# Patient Record
Sex: Female | Born: 1990
Health system: Southern US, Community
[De-identification: ages and names within clinical notes are randomized; demographics above are authoritative.]

## PROBLEM LIST (undated history)

## (undated) DIAGNOSIS — Z803 Family history of malignant neoplasm of breast: Secondary | ICD-10-CM

## (undated) DIAGNOSIS — F419 Anxiety disorder, unspecified: Secondary | ICD-10-CM

## (undated) DIAGNOSIS — R87619 Unspecified abnormal cytological findings in specimens from cervix uteri: Secondary | ICD-10-CM

## (undated) DIAGNOSIS — N946 Dysmenorrhea, unspecified: Secondary | ICD-10-CM

## (undated) HISTORY — DX: Unspecified abnormal cytological findings in specimens from cervix uteri: R87.619

## (undated) HISTORY — DX: Dysmenorrhea, unspecified: N94.6

## (undated) HISTORY — DX: Anxiety disorder, unspecified: F41.9

## (undated) HISTORY — DX: Family history of malignant neoplasm of breast: Z80.3

---

## 2016-11-21 DIAGNOSIS — R87619 Unspecified abnormal cytological findings in specimens from cervix uteri: Secondary | ICD-10-CM

## 2016-11-21 HISTORY — PX: CERVICAL BIOPSY  W/ LOOP ELECTRODE EXCISION: SUR135

## 2016-11-21 HISTORY — DX: Unspecified abnormal cytological findings in specimens from cervix uteri: R87.619

## 2019-02-16 ENCOUNTER — Other Ambulatory Visit: Payer: Self-pay

## 2019-02-16 ENCOUNTER — Ambulatory Visit: Payer: 59 | Admitting: Obstetrics and Gynecology

## 2019-02-16 ENCOUNTER — Other Ambulatory Visit (HOSPITAL_COMMUNITY)
Admission: RE | Admit: 2019-02-16 | Discharge: 2019-02-16 | Disposition: A | Discharge status: Home / Self Care | Payer: 59 | Source: Ambulatory Visit | Attending: Obstetrics and Gynecology | Admitting: Obstetrics and Gynecology

## 2019-02-16 ENCOUNTER — Encounter: Payer: Self-pay | Admitting: Obstetrics and Gynecology

## 2019-02-16 VITALS — BP 110/64 | HR 60 | Resp 16 | Ht 65.5 in | Wt 145.0 lb

## 2019-02-16 DIAGNOSIS — Z803 Family history of malignant neoplasm of breast: Secondary | ICD-10-CM | POA: Diagnosis not present

## 2019-02-16 DIAGNOSIS — Z01419 Encounter for gynecological examination (general) (routine) without abnormal findings: Secondary | ICD-10-CM | POA: Diagnosis not present

## 2019-02-16 DIAGNOSIS — Z3169 Encounter for other general counseling and advice on procreation: Secondary | ICD-10-CM | POA: Diagnosis not present

## 2019-02-16 NOTE — Patient Instructions (Signed)

## 2019-02-16 NOTE — Progress Notes (Signed)
28 y.o. G71P0000 Married Caucasian female here for annual exam.    Stopped using OCPs and planning for future pregnancy.  Has moliminal symptoms.   PCP: Stephanie Buttery, PA-C  Patient's last menstrual period was 02/09/2019 (exact date).     Period Cycle (Days): 30 Period Duration (Days): 7 days Period Pattern: Regular Menstrual Flow: (heavy first 2 days then tapers) Menstrual Control: Tampon, Thin pad Menstrual Control Change Freq (Hours): changes tampon/pad every 6 hours on heaviest day Dysmenorrhea: (!) Mild Dysmenorrhea Symptoms: Cramping     Sexually active: Yes.   female The current method of family planning is condoms everytime.    Exercising: Yes.    high intensity training, cardio and weights Smoker:  no  Health Maintenance: Pap:  11/2017 normal per patient History of abnormal Pap:  Yes, Hx of LEEP for CIN 2/3 11/2016 in [place], [place].  Follow up paps normal. MMG:  n/a Colonoscopy:  n/a BMD:   n/a  Result  n/a TDaP:  07/2018 Gardasil:   Yes, completed x 3 HIV: Neg 10 years ago Hep C: no Screening Labs:   --- Flu vaccine:  Done.    reports that she has never smoked. She has never used smokeless tobacco. She reports current alcohol use of about 1.0 standard drinks of alcohol per week. She reports that she does not use drugs.  Past Medical History:  Diagnosis Date  . Abnormal Pap smear of cervix 11/2016   Hx of LEEP 11/2016 in [place], [place] for CIN 2/3  . Anxiety    Not formally dx'd  . Dysmenorrhea     Past Surgical History:  Procedure Laterality Date  . CERVICAL BIOPSY  W/ LOOP ELECTRODE EXCISION  11/2016   [place] [place]    Current Outpatient Medications  Medication Sig Dispense Refill  . Biotin 1000 MCG CHEW Chew 1 tablet by mouth daily.    . cetirizine (ZYRTEC) 10 MG tablet Take 1 tablet by mouth as needed.    . Multiple Vitamins-Minerals (MULTIVITAMIN GUMMIES ADULT PO) Take 1 tablet by mouth daily.    Marland Kitchen tretinoin (RETIN-A) 0.025 % cream Apply 1 application  topically as needed.     No current facility-administered medications for this visit.     Family History  Problem Relation Age of Onset  . Breast cancer Mother 79       Dec with Met.Breast Ca  . Migraines Mother   . Migraines Sister   . Migraines Brother   . Stroke Maternal Grandfather   . Diabetes Paternal Grandmother   . Diabetes Paternal Grandfather     Review of Systems  All other systems reviewed and are negative.   Exam:   BP 110/64 (BP Location: Right Arm, Patient Position: Sitting, Cuff Size: Normal)   Pulse 60   Resp 16   Ht 5' 5.5" (1.664 m)   Wt 145 lb (65.8 kg)   LMP 02/09/2019 (Exact Date)   BMI 23.76 kg/m     General appearance: alert, cooperative and appears stated age Head: Normocephalic, without obvious abnormality, atraumatic Neck: no adenopathy, supple, symmetrical, trachea midline and thyroid normal to inspection and palpation Lungs: clear to auscultation bilaterally Breasts: normal appearance, no masses or tenderness, No nipple retraction or dimpling, No nipple discharge or bleeding, No axillary or supraclavicular adenopathy Heart: regular rate and rhythm Abdomen: soft, non-tender; no masses, no organomegaly Extremities: extremities normal, atraumatic, no cyanosis or edema Skin: Skin color, texture, turgor normal. No rashes or lesions Lymph nodes: Cervical, supraclavicular, and axillary  nodes normal. No abnormal inguinal nodes palpated Neurologic: Grossly normal  Pelvic: External genitalia:  no lesions              Urethra:  normal appearing urethra with no masses, tenderness or lesions              Bartholins and Skenes: normal                 Vagina: normal appearing vagina with normal color and discharge, no lesions              Cervix: no lesions.  Bleeds with cytobrush.               Pap taken: Yes.   Bimanual Exam:  Uterus:  normal size, contour, position, consistency, mobility, non-tender              Adnexa: no mass, fullness,  tenderness              Chaperone was present for exam.  Assessment:   Well woman visit with normal exam. Hx LEEP for CIN II/III. FH of breast cancer.  Mother had at age 96.  Desire for fertility.  Hx low WBC.   Plan: Mammogram screening. Recommended self breast awareness. Pap and HR HPV as above. Guidelines for Calcium, Vitamin D, regular exercise program including cardiovascular and weight bearing exercise. Start PNV.  Rubella screen and CBC with diff.  Reading about pregnancy planning discussed.  Referral for genetic counseling and testing.  Follow up annually and prn.   After visit summary provided.

## 2019-02-17 LAB — CBC WITH DIFFERENTIAL/PLATELET
Basophils Absolute: 0 10*3/uL (ref 0.0–0.2)
Basos: 1 %
EOS (ABSOLUTE): 0 10*3/uL (ref 0.0–0.4)
Eos: 1 %
Hematocrit: 38.6 % (ref 34.0–46.6)
Hemoglobin: 12.2 g/dL (ref 11.1–15.9)
Immature Grans (Abs): 0 10*3/uL (ref 0.0–0.1)
Immature Granulocytes: 0 %
Lymphocytes Absolute: 1.6 10*3/uL (ref 0.7–3.1)
Lymphs: 34 %
MCH: 30 pg (ref 26.6–33.0)
MCHC: 31.6 g/dL (ref 31.5–35.7)
MCV: 95 fL (ref 79–97)
Monocytes Absolute: 0.3 10*3/uL (ref 0.1–0.9)
Monocytes: 6 %
Neutrophils Absolute: 2.7 10*3/uL (ref 1.4–7.0)
Neutrophils: 58 %
PLATELETS: 280 10*3/uL (ref 150–450)
RBC: 4.06 x10E6/uL (ref 3.77–5.28)
RDW: 11.8 % (ref 11.7–15.4)
WBC: 4.7 10*3/uL (ref 3.4–10.8)

## 2019-02-17 LAB — RUBELLA SCREEN: Rubella Antibodies, IGG: 4.25 index (ref >0.99)

## 2019-02-21 LAB — CYTOLOGY - PAP
Diagnosis: NEGATIVE
HPV: NOT DETECTED

## 2019-02-22 ENCOUNTER — Telehealth: Payer: Self-pay | Admitting: Genetic Counselor

## 2019-02-22 ENCOUNTER — Encounter: Payer: Self-pay | Admitting: Genetic Counselor

## 2019-02-22 ENCOUNTER — Encounter: Payer: Self-pay | Admitting: Obstetrics and Gynecology

## 2019-02-22 NOTE — Telephone Encounter (Signed)
A genetic counseling appt has been scheduled for the pt to see Maylon Cos on 4/6 at 9am. Pt aware to arrive 15 minutes early. Letter mailed.

## 2019-03-28 ENCOUNTER — Other Ambulatory Visit: Payer: 59

## 2019-03-28 ENCOUNTER — Encounter: Payer: 59 | Admitting: Genetic Counselor

## 2019-06-14 ENCOUNTER — Telehealth: Payer: Self-pay | Admitting: Genetic Counselor

## 2019-06-14 NOTE — Telephone Encounter (Signed)
Call pt no answer left mess on voice mail.

## 2019-06-15 ENCOUNTER — Encounter: Payer: Self-pay | Admitting: Genetic Counselor

## 2019-06-15 ENCOUNTER — Other Ambulatory Visit: Payer: Self-pay | Admitting: Genetic Counselor

## 2019-06-15 ENCOUNTER — Inpatient Hospital Stay: Payer: 59 | Attending: Genetic Counselor | Admitting: Genetic Counselor

## 2019-06-15 ENCOUNTER — Inpatient Hospital Stay: Payer: 59

## 2019-06-15 DIAGNOSIS — Z803 Family history of malignant neoplasm of breast: Secondary | ICD-10-CM

## 2019-06-15 NOTE — Progress Notes (Addendum)
REFERRING PROVIDER: Waynette Buttery, PA-C [address] [place],  [place] [address]  PRIMARY PROVIDER:  Waynette Buttery, PA-C  PRIMARY REASON FOR VISIT:  1. Family history of breast cancer      HISTORY OF PRESENT ILLNESS:   I connected with Ms. Durkee on 06/15/2019 at 9 AM EDT by Webex video conference and verified that I am speaking with the correct person using two identifiers.   Patient location: Work Provider location: Work   Ms. Kilcrease, a 28 y.o. female, was seen for a [facility] cancer genetics consultation at the request of Dr. Clydene Laming due to a family history of breast cancer.  Ms. Westervelt presents to clinic today to discuss the possibility of a hereditary predisposition to cancer, genetic testing, and to further clarify her future cancer risks, as well as potential cancer risks for family members.   Ms. Gabor is a 28 y.o. female with no personal history of cancer.    CANCER HISTORY:  Oncology History   No history exists.     RISK FACTORS:  Menarche was at age 17.  First live birth at age N/A.  OCP use for approximately 10 years.  Ovaries intact: yes.  Hysterectomy: no.  Menopausal status: premenopausal.  HRT use: 0 years. Colonoscopy: no; not examined. Mammogram within the last year: no. Number of breast biopsies: 0. Up to date with pelvic exams: yes. Any excessive radiation exposure in the past: no  Past Medical History:  Diagnosis Date  . Abnormal Pap smear of cervix 11/2016   Hx of LEEP 10/2015 in [place], [place] for CIN 2 from colposcopy.  Final LEEP pathology - LGSIL.  Marland Kitchen Anxiety    Not formally dx'd  . Dysmenorrhea   . Family history of breast cancer     Past Surgical History:  Procedure Laterality Date  . CERVICAL BIOPSY  W/ LOOP ELECTRODE EXCISION  11/2016   [place], [place]    Social History   Socioeconomic History  . Marital status: Married    Spouse name: Not on file  . Number of children: Not on file  . Years of education: Not on file  . Highest  education level: Not on file  Occupational History  . Not on file  Social Needs  . Financial resource strain: Not on file  . Food insecurity    Worry: Not on file    Inability: Not on file  . Transportation needs    Medical: Not on file    Non-medical: Not on file  Tobacco Use  . Smoking status: Never Smoker  . Smokeless tobacco: Never Used  Substance and Sexual Activity  . Alcohol use: Yes    Alcohol/week: 1.0 standard drinks    Types: 1 Glasses of wine per week  . Drug use: Never  . Sexual activity: Yes    Birth control/protection: Condom    Comment: condoms everytime  Lifestyle  . Physical activity    Days per week: Not on file    Minutes per session: Not on file  . Stress: Not on file  Relationships  . Social Herbalist on phone: Not on file    Gets together: Not on file    Attends religious service: Not on file    Active member of club or organization: Not on file    Attends meetings of clubs or organizations: Not on file    Relationship status: Not on file  Other Topics Concern  . Not on file  Social  History Narrative  . Not on file     FAMILY HISTORY:  We obtained a detailed, 4-generation family history.  Significant diagnoses are listed below: Family History  Problem Relation Age of Onset  . Breast cancer Mother 35       Dec with Met.Breast Ca  . Migraines Mother   . Migraines Sister   . Migraines Brother   . Stroke Maternal Grandfather   . Diabetes Paternal Grandmother   . Diabetes Paternal Grandfather   . Leukemia Cousin 10       pat first cousin    The patient does not have children.  She has two sisters and a brother who are all cancer free.  Her mother is deceased and her father is living.  Her mother developed breast cancer at 78 and died at 28.  She has one brother who is cancer free.  The maternal grandparents are deceased from non-cancer related issues.  The patient's father is living at 75.  He has eight brothers and one sister  who are all cancer free.  One brother has a son who had leukemia at age 60.  Ms. Rozier is unaware of previous family history of genetic testing for hereditary cancer risks. Patient's maternal ancestors are of [place] and [place] descent, and paternal ancestors are of [place] descent. There is no reported Ashkenazi Jewish ancestry. There is no known consanguinity.  GENETIC COUNSELING ASSESSMENT: Ms. Vanorman is a 28 y.o. female with a family history of breast cancer which is somewhat suggestive of a hereditary cancer syndrome and predisposition to cancer. We, therefore, discussed and recommended the following at today's visit.   DISCUSSION: We discussed that 5 - 10% of breast cancer is hereditary, with most cases associated with BRCA mutations.  There are other genes that can be associated with hereditary breast cancer syndromes.  These include ATM, CHEK2 and PALB2.  We discussed that testing is beneficial for a couple reasons including knowing how to follow individuals and understand if other family members could be at risk for cancer and allow them to undergo genetic testing.   We discussed that some people do not want to undergo genetic testing due to fear of genetic discrimination.  A federal law called the Genetic Information Non-Discrimination Act (GINA) of 2008 helps protect individuals against genetic discrimination based on their genetic test results.  It impacts both health insurance and employment.  With health insurance, it protects against increased premiums, being kicked off insurance or being forced to take a test in order to be insured.  For employment it protects against hiring, firing and promoting decisions based on genetic test results.  Health status due to a cancer diagnosis is not protected under GINA.   We reviewed the characteristics, features and inheritance patterns of hereditary cancer syndromes. We also discussed genetic testing, including the appropriate family members to test, the  process of testing, insurance coverage and turn-around-time for results. We discussed the implications of a negative, positive and/or variant of uncertain significant result. We recommended Ms. Brogden pursue genetic testing for the multi cancer gene panel. The Multi-Gene Panel offered by [facility] includes sequencing and/or deletion duplication testing of the following 85 genes: AIP, ALK, APC, ATM, AXIN2,BAP1,  BARD1, BLM, BMPR1A, BRCA1, BRCA2, BRIP1, CASR, CDC73, CDH1, CDK4, CDKN1B, CDKN1C, CDKN2A (p14ARF), CDKN2A (p16INK4a), CEBPA, CHEK2, CTNNA1, DICER1, DIS3L2, EGFR (c.2369C>T, p.Thr790Met variant only), EPCAM (Deletion/duplication testing only), FH, FLCN, GATA2, GPC3, GREM1 (Promoter region deletion/duplication testing only), HOXB13 (c.251G>A, p.Gly84Glu), HRAS, KIT, MAX, MEN1, MET,  MITF (c.952G>A, p.Glu318Lys variant only), MLH1, MSH2, MSH3, MSH6, MUTYH, NBN, NF1, NF2, NTHL1, PALB2, PDGFRA, PHOX2B, PMS2, POLD1, POLE, POT1, PRKAR1A, PTCH1, PTEN, RAD50, RAD51C, RAD51D, RB1, RECQL4, RET, RNF43, RUNX1, SDHAF2, SDHA (sequence changes only), SDHB, SDHC, SDHD, SMAD4, SMARCA4, SMARCB1, SMARCE1, STK11, SUFU, TERC, TERT, TMEM127, TP53, TSC1, TSC2, VHL, WRN and WT1.    Based on Ms. Wiersma's family history of cancer, she meets medical criteria for genetic testing. Despite that she meets criteria, she may still have an out of pocket cost. We discussed that if her out of pocket cost for testing is over $100, the laboratory will call and confirm whether she wants to proceed with testing.  If the out of pocket cost of testing is less than $100 she will be billed by the genetic testing laboratory.   Based on the patient's family history, a statistical model Designer, fashion/clothing) was used to estimate her risk of developing breast cancer. This estimates her lifetime risk of developing breast cancer to be approximately 28.9%. This estimation does not consider any genetic testing results.  The patient's lifetime breast cancer risk is a  preliminary estimate based on available information using one of several models endorsed by the [place] (ACS). The ACS recommends consideration of breast MRI screening as an adjunct to mammography for patients at high risk (defined as 20% or greater lifetime risk).   Ms. Steenson has been determined to be at high risk for breast cancer.  Therefore, we recommend that annual screening with mammography and breast MRI begin at age 92, or 10 years prior to the age of breast cancer diagnosis in a relative (whichever is earlier).  We discussed that MS. Formoso should discuss her individual situation with her referring physician and determine a breast cancer screening plan with which they are both comfortable.  We will refer her to the [facility] Risk Breast clinic.    PLAN: After considering the risks, benefits, and limitations, Ms. Prada provided informed consent to pursue genetic testing and the blood sample was sent to [facility] for analysis of the multi cancer panel. Results should be available within approximately 2-3 weeks' time, at which point they will be disclosed by telephone to Ms. Sabo, as will any additional recommendations warranted by these results. Ms. Belson will receive a summary of her genetic counseling visit and a copy of her results once available. This information will also be available in Epic.   Lastly, we encouraged Ms. Mcarthy to remain in contact with cancer genetics annually so that we can continuously update the family history and inform her of any changes in cancer genetics and testing that may be of benefit for this family.   Ms. Marsland questions were answered to her satisfaction today. Our contact information was provided should additional questions or concerns arise. Thank you for the referral and allowing [place] to share in the care of your patient.    P. Florene Glen, Ko Olina, [facility] Certified Genetic Counselor [email or web address] phone: [phone]  The  patient was seen for a total of 38 minutes in face-to-face genetic counseling.  This patient was discussed with Drs. Magrinat, Lindi Adie and/or Burr Medico who agrees with the above.    _______________________________________________________________________ For Office Staff:  Number of people involved in session: 1 Was an Intern/ student involved with case: no

## 2019-06-16 ENCOUNTER — Other Ambulatory Visit: Payer: Self-pay

## 2019-06-16 ENCOUNTER — Inpatient Hospital Stay: Payer: 59

## 2019-06-16 DIAGNOSIS — Z803 Family history of malignant neoplasm of breast: Secondary | ICD-10-CM

## 2019-06-28 ENCOUNTER — Other Ambulatory Visit: Payer: Self-pay | Admitting: Oncology

## 2019-07-05 ENCOUNTER — Ambulatory Visit: Payer: Self-pay | Admitting: Genetic Counselor

## 2019-07-05 ENCOUNTER — Telehealth: Payer: Self-pay | Admitting: Genetic Counselor

## 2019-07-05 ENCOUNTER — Encounter: Payer: Self-pay | Admitting: Genetic Counselor

## 2019-07-05 DIAGNOSIS — Z1379 Encounter for other screening for genetic and chromosomal anomalies: Secondary | ICD-10-CM | POA: Insufficient documentation

## 2019-07-05 NOTE — Progress Notes (Signed)
HPI:  Ms. Nawrot was previously seen in the [facility] clinic due to a family history of breast cancer and concerns regarding a hereditary predisposition to cancer. Please refer to our prior cancer genetics clinic note for more information regarding our discussion, assessment and recommendations, at the time. Ms. Vondra recent genetic test results were disclosed to her, as were recommendations warranted by these results. These results and recommendations are discussed in more detail below.  CANCER HISTORY:  Oncology History   No history exists.    FAMILY HISTORY:  We obtained a detailed, 4-generation family history.  Significant diagnoses are listed below: Family History  Problem Relation Age of Onset  . Breast cancer Mother 69       Dec with Met.Breast Ca  . Migraines Mother   . Migraines Sister   . Migraines Brother   . Stroke Maternal Grandfather   . Diabetes Paternal Grandmother   . Diabetes Paternal Grandfather   . Leukemia Cousin 10       pat first cousin    The patient does not have children.  She has two sisters and a brother who are all cancer free.  Her mother is deceased and her father is living.  Her mother developed breast cancer at 56 and died at 49.  She has one brother who is cancer free.  The maternal grandparents are deceased from non-cancer related issues.  The patient's father is living at 7.  He has eight brothers and one sister who are all cancer free.  One brother has a son who had leukemia at age 50.  Ms. Kanno is unaware of previous family history of genetic testing for hereditary cancer risks. Patient's maternal ancestors are of [place] and [place] descent, and paternal ancestors are of [place] descent. There is no reported Ashkenazi Jewish ancestry. There is no known consanguinity.    GENETIC TEST RESULTS: Genetic testing reported out on July 01, 2019 through the Multi-cancer panel found no pathogenic mutations. The Multi-Gene Panel  offered by [facility] includes sequencing and/or deletion duplication testing of the following 85 genes: AIP, ALK, APC, ATM, AXIN2,BAP1,  BARD1, BLM, BMPR1A, BRCA1, BRCA2, BRIP1, CASR, CDC73, CDH1, CDK4, CDKN1B, CDKN1C, CDKN2A (p14ARF), CDKN2A (p16INK4a), CEBPA, CHEK2, CTNNA1, DICER1, DIS3L2, EGFR (c.2369C>T, p.Thr790Met variant only), EPCAM (Deletion/duplication testing only), FH, FLCN, GATA2, GPC3, GREM1 (Promoter region deletion/duplication testing only), HOXB13 (c.251G>A, p.Gly84Glu), HRAS, KIT, MAX, MEN1, MET, MITF (c.952G>A, p.Glu318Lys variant only), MLH1, MSH2, MSH3, MSH6, MUTYH, NBN, NF1, NF2, NTHL1, PALB2, PDGFRA, PHOX2B, PMS2, POLD1, POLE, POT1, PRKAR1A, PTCH1, PTEN, RAD50, RAD51C, RAD51D, RB1, RECQL4, RET, RNF43, RUNX1, SDHAF2, SDHA (sequence changes only), SDHB, SDHC, SDHD, SMAD4, SMARCA4, SMARCB1, SMARCE1, STK11, SUFU, TERC, TERT, TMEM127, TP53, TSC1, TSC2, VHL, WRN and WT1.  The test report has been scanned into EPIC and is located under the Molecular Pathology section of the Results Review tab.  A portion of the result report is included below for reference.     We discussed with Ms. Wimer that because current genetic testing is not perfect, it is possible there may be a gene mutation in one of these genes that current testing cannot detect, but that chance is small.  We also discussed, that there could be another gene that has not yet been discovered, or that we have not yet tested, that is responsible for the cancer diagnoses in the family. It is also possible there is a hereditary cause for the cancer in the family that Ms. Blumenthal did not inherit and therefore  was not identified in her testing.  Therefore, it is important to remain in touch with cancer genetics in the future so that we can continue to offer Ms. Afshar the most up to date genetic testing.   Genetic testing did identify a variant of uncertain significance (VUS) was identified in the APC gene called c.7389A>C.  At this time, it is  unknown if this variant is associated with increased cancer risk or if this is a normal finding, but most variants such as this get reclassified to being inconsequential. It should not be used to make medical management decisions. With time, we suspect the lab will determine the significance of this variant, if any. If we do learn more about it, we will try to contact Ms. Passon to discuss it further. However, it is important to stay in touch with [place] periodically and keep the address and phone number up to date.  ADDITIONAL GENETIC TESTING: We discussed with Ms. Fryman that her genetic testing was fairly extensive.  If there are genes identified to increase cancer risk that can be analyzed in the future, we would be happy to discuss and coordinate this testing at that time.    CANCER SCREENING RECOMMENDATIONS: Ms. Beveridge's test result is considered negative (normal).  This means that we have not identified a hereditary cause for her family history of cancer at this time. Most cancers happen by chance and this negative test suggests that her cancer may fall into this category.    While reassuring, this does not definitively rule out a hereditary predisposition to cancer. It is still possible that there could be genetic mutations that are undetectable by current technology. There could be genetic mutations in genes that have not been tested or identified to increase cancer risk.  Therefore, it is recommended she continue to follow the cancer management and screening guidelines provided by her primary healthcare provider.   An individual's cancer risk and medical management are not determined by genetic test results alone. Overall cancer risk assessment incorporates additional factors, including personal medical history, family history, and any available genetic information that may result in a personalized plan for cancer prevention and surveillance  Based on Ms. Friberg's family of cancer, as well as her genetic  test results, statistical models (Tyrer Cusik)  and literature data were used to estimate her risk of developing breast cancer. These estimate her lifetime risk of developing breast cancer to be approximately 28.9%.  The patient's lifetime breast cancer risk is a preliminary estimate based on available information using one of several models endorsed by the [place] (ACS). The ACS recommends consideration of breast MRI screening as an adjunct to mammography for patients at high risk (defined as 20% or greater lifetime risk). A more detailed breast cancer risk assessment can be considered, if clinically indicated.   Ms. Cozza has been determined to be at high risk for breast cancer.  Therefore, we recommend that annual screening with mammography and breast MRI begin at age 22, or 10 years prior to the age of breast cancer diagnosis in a relative (whichever is earlier).  We discussed that Ms. Mungin should discuss her individual situation with her referring physician and determine a breast cancer screening plan with which they are both comfortable.    We, therefore, discussed that it is reasonable for Ms. Breau to be followed by a high-risk breast cancer clinic; in addition to a yearly mammogram and physical exam by a healthcare provider, she should discuss the usefulness  of an annual breast MRI with the high-risk clinic providers..   RECOMMENDATIONS FOR FAMILY MEMBERS:  Individuals in this family might be at some increased risk of developing cancer, over the general population risk, simply due to the family history of cancer.  We recommended women in this family have a yearly mammogram beginning at age 65, or 11 years younger than the earliest onset of cancer, an annual clinical breast exam, and perform monthly breast self-exams. Women in this family should also have a gynecological exam as recommended by their primary provider. All family members should have a colonoscopy by age 8.  It is  also possible there is a hereditary cause for the cancer in Ms. Lumadue's family that she did not inherit and therefore was not identified in her.  Based on Ms. Espy's family history, we recommended her siblings have genetic counseling and testing. Ms. Simao will let us know if we can be of any assistance in coordinating genetic counseling and/or testing for this family member.   FOLLOW-UP: Lastly, we discussed with Ms. Koran that cancer genetics is a rapidly advancing field and it is possible that new genetic tests will be appropriate for her and/or her family members in the future. We encouraged her to remain in contact with cancer genetics on an annual basis so we can update her personal and family histories and let her know of advances in cancer genetics that may benefit this family.   Our contact number was provided. Ms. Scardina questions were answered to her satisfaction, and she knows she is welcome to call us at anytime with additional questions or concerns.   Roma Kayser, MS, [facility] Certified Genetic Counselor [email or web address]

## 2019-07-05 NOTE — Telephone Encounter (Signed)
Revealed negative genetic testing.  Discussed that we do not know why she has breast cancer or why there is cancer in the family. It could be due to a different gene that we are not testing, or maybe our current technology may not be able to pick something up.  It will be important for her to keep in contact with genetics to keep up with whether additional testing may be needed.  One VUS in [facility].

## 2019-07-17 ENCOUNTER — Telehealth: Payer: Self-pay | Admitting: Obstetrics and Gynecology

## 2019-07-17 DIAGNOSIS — Z803 Family history of malignant neoplasm of breast: Secondary | ICD-10-CM

## 2019-07-17 DIAGNOSIS — Z9189 Other specified personal risk factors, not elsewhere classified: Secondary | ICD-10-CM

## 2019-07-17 NOTE — Telephone Encounter (Signed)
-----   Message from Chauncey Cruel, MD sent at 06/28/2019  9:01 AM EDT ----- So far do not see appt in high risk clinic for this patient  Thanks  GM ----- Message ----- From: Clarene Essex, Counselor Sent: 06/15/2019  11:02 AM EDT To: Brook Oletta Lamas, MD, #  Patient at high risk for breast cancer.  We will refer for high risk clinic.  Patient understands this.  Drs. Burr Medico, Magrinat, and Mosetta Pigeon, please schedule in [facility].

## 2019-07-17 NOTE — Telephone Encounter (Signed)
Please reach out to patient to see if she desires to be referred to the high risk breast cancer screening clinic.   Thank you.

## 2019-07-18 NOTE — Telephone Encounter (Signed)
Spoke with patient, advised as seen below per Dr. Quincy Simmonds. Patient is agreeable to referral to high risk breast cancer clinic, Dr. Jana Hakim. Order placed for referral. Advised patient she will be notified of appt details once scheduled. Patient verbalizes understanding.   Routing to provider for final review. Patient is agreeable to disposition. Will close encounter.   Cc: Magdalene Patricia

## 2019-07-26 ENCOUNTER — Telehealth: Payer: Self-pay | Admitting: Oncology

## 2019-07-26 NOTE — Telephone Encounter (Signed)
Received a new patient referral from Dr. Quincy Simmonds for high risk breast clinic. Pt has been cld and scheduled to see Dr. Jana Hakim on 8/26 at 4pm.

## 2019-08-16 DIAGNOSIS — L700 Acne vulgaris: Secondary | ICD-10-CM | POA: Diagnosis not present

## 2019-08-16 DIAGNOSIS — Z0000 Encounter for general adult medical examination without abnormal findings: Secondary | ICD-10-CM | POA: Diagnosis not present

## 2019-08-16 DIAGNOSIS — Z6824 Body mass index (BMI) 24.0-24.9, adult: Secondary | ICD-10-CM | POA: Diagnosis not present

## 2019-08-16 NOTE — Progress Notes (Signed)
[facility]  [phone] [phone]    ID: Stephanie Cruz DOB: May 07, 1991  MR#: [id number]  TDV#:[id number]  Patient Care Team: Waynette Buttery, PA-C as PCP - General (Family Medicine) Ashby Leflore, Virgie Dad, MD as Consulting Physician (Oncology) Nunzio Cobbs, MD as Consulting Physician (Obstetrics and Gynecology) OTHER MD:   CHIEF COMPLAINT: Breast cancer high risk  CURRENT TREATMENT: Observation   HISTORY OF CURRENT ILLNESS: Cambri Cruz was referred to genetic counseling by her PCP, Corky Crafts, PA-C due to her mother's breast cancer diagnosis at the age of 22. It was estimated at that time that her risk for developing breast cancer was 28.9% via the [place].   Stephanie Cruz did undergo genetic testing on 06/16/2019, with results that revealed no pathogenic mutations through the The Multi-Gene Panel offered by [facility] in AIP, ALK, APC, ATM, AXIN2,BAP1,  BARD1, BLM, BMPR1A, BRCA1, BRCA2, BRIP1, CASR, CDC73, CDH1, CDK4, CDKN1B, CDKN1C, CDKN2A (p14ARF), CDKN2A (p16INK4a), CEBPA, CHEK2, CTNNA1, DICER1, DIS3L2, EGFR (c.2369C>T, p.Thr790Met variant only), EPCAM (Deletion/duplication testing only), FH, FLCN, GATA2, GPC3, GREM1 (Promoter region deletion/duplication testing only), HOXB13 (c.251G>A, p.Gly84Glu), HRAS, KIT, MAX, MEN1, MET, MITF (c.952G>A, p.Glu318Lys variant only), MLH1, MSH2, MSH3, MSH6, MUTYH, NBN, NF1, NF2, NTHL1, PALB2, PDGFRA, PHOX2B, PMS2, POLD1, POLE, POT1, PRKAR1A, PTCH1, PTEN, RAD50, RAD51C, RAD51D, RB1, RECQL4, RET, RNF43, RUNX1, SDHAF2, SDHA (sequence changes only), SDHB, SDHC, SDHD, SMAD4, SMARCA4, SMARCB1, SMARCE1, STK11, SUFU, TERC, TERT, TMEM127, TP53, TSC1, TSC2, VHL, WRN and WT1. A variant of uncertain significance was identified in APC (c.7389A>C (p.Glu2463Asp)).   The patient's subsequent history is as detailed below.   INTERVAL HISTORY: Stephanie Cruz was evaluated in the high risk cancer  clinic on 2019/09/20.   REVIEW OF SYSTEMS: Stephanie Cruz exercises chiefly by running and hiking.  Of course her work is very physical as well.  She denies unusual headaches, visual changes, nausea, vomiting, stiff neck, dizziness, or gait imbalance. There has been no cough, phlegm production, or pleurisy, no chest pain or pressure, and no change in bowel or bladder habits. The patient denies fever, rash, bleeding, unexplained fatigue or unexplained weight loss. A detailed review of systems was otherwise entirely negative.   PAST MEDICAL HISTORY: Past Medical History:  Diagnosis Date  . Abnormal Pap smear of cervix 11/2016   Hx of LEEP 10/2015 in [place], [place] for CIN 2 from colposcopy.  Final LEEP pathology - LGSIL.  Marland Kitchen Anxiety    Not formally dx'd  . Dysmenorrhea   . Family history of breast cancer      PAST SURGICAL HISTORY: Past Surgical History:  Procedure Laterality Date  . CERVICAL BIOPSY  W/ LOOP ELECTRODE EXCISION  11/2016   [place], [place]     FAMILY HISTORY: Family History  Problem Relation Age of Onset  . Breast cancer Mother 64       Dec with Met.Breast Ca  . Migraines Mother   . Migraines Sister   . Migraines Brother   . Stroke Maternal Grandfather   . Diabetes Paternal Grandmother   . Diabetes Paternal Grandfather   . Leukemia Cousin 10       pat first cousin   Astin's father is 38 as of 08-26-19. Patients' mother died from breast cancer at age 89; it was originally diagnosed at age 17. The patient has 1 brother and 2 sisters. Patient denies anyone in her family having ovarian, prostate, or pancreatic cancer. Jaima's paternal first cousin was diagnosed with leukemia at age 75.  GYNECOLOGIC HISTORY:  No LMP recorded. Menarche: 28 years old Age at first live birth: N/A GX P: 0 LMP: Regular, lasting 7 days, 3 heavy Contraceptive: Barrier HRT: no  Hysterectomy?: no BSO?: no   SOCIAL HISTORY: (Current as of 08/17/2019) Stephanie Cruz has a masters in physical therapy  and works in the [employer] system, primarily at the most is come [employer], but possibly soon at the [employer].  She commutes here from [facility].  Her husband Edison Nasuti is a Electrical engineer working in [place].  They are thinking of starting a family within the next year  ADVANCED DIRECTIVES: In the absence of any document to the contrary Shandie's husband Edison Nasuti is her healthcare power of attorney   HEALTH MAINTENANCE: Social History   Tobacco Use  . Smoking status: Never Smoker  . Smokeless tobacco: Never Used  Substance Use Topics  . Alcohol use: Yes    Alcohol/week: 1.0 standard drinks    Types: 1 Glasses of wine per week  . Drug use: Never    Colonoscopy: no  PAP: UTD; Dr. Gala Romney  Bone density: no Mammography: no  No Known Allergies  Current Outpatient Medications  Medication Sig Dispense Refill  . Biotin 1000 MCG CHEW Chew 1 tablet by mouth daily.    . cetirizine (ZYRTEC) 10 MG tablet Take 1 tablet by mouth as needed.    . cholecalciferol (VITAMIN D3) 25 MCG (1000 UT) tablet Take 5,000 Units by mouth daily.    . Prenatal Vit-Fe Fumarate-FA (PRENATAL MULTIVITAMIN) TABS tablet Take 1 tablet by mouth daily at 12 noon.    . tretinoin (RETIN-A) 0.025 % cream Apply 1 application topically as needed.     No current facility-administered medications for this visit.      OBJECTIVE: 28-year old woman in no acute distress  Vitals:   08/17/19 1607  BP: (!) 111/58  Pulse: 74  Resp: 18  Temp: 99.1 F (37.3 C)  SpO2: 98%   Wt Readings from Last 3 Encounters:  08/17/19 148 lb 1.6 oz (67.2 kg)  02/16/19 145 lb (65.8 kg)   Body mass index is 24.27 kg/m.   ECOG FS:0 - Asymptomatic  Ocular: Sclerae unicteric, pupils round and equal Ear-nose-throat: Wearing a mask Lymphatic: No cervical or supraclavicular adenopathy Lungs no rales or rhonchi Heart regular rate and rhythm Abd soft, nontender, positive bowel sounds MSK no focal spinal tenderness, no joint  edema Neuro: non-focal, well-oriented, appropriate affect Breasts: No masses palpated; no skin or nipple changes of concern.  Both axillae are benign.   LAB RESULTS:  CMP  No results found for: NA, K, CL, CO2, GLUCOSE, BUN, CREATININE, CALCIUM, PROT, ALBUMIN, AST, ALT, ALKPHOS, BILITOT, GFRNONAA, GFRAA  No results found for: TOTALPROTELP, ALBUMINELP, A1GS, A2GS, BETS, BETA2SER, GAMS, MSPIKE, SPEI  No results found for: Nils Pyle, [facility]  Lab Results  Component Value Date   WBC 4.7 02/16/2019   NEUTROABS 2.7 02/16/2019   HGB 12.2 02/16/2019   HCT 38.6 02/16/2019   MCV 95 02/16/2019   PLT 280 02/16/2019    _0 @  No results found for: LABCA2  No components found for: PJASNK539  No results for input(s): INR in the last 168 hours.  No results found for: LABCA2  No results found for: JQB341  No results found for: PFX902  No results found for: IOX735  No results found for: CA2729  No components found for: HGQUANT  No results found for: CEA1 / No results found for: CEA1  No results found for: AFPTUMOR  No results found for: CHROMOGRNA  No results found for: PSA1  No visits with results within 3 Day(s) from this visit.  Latest known visit with results is:  Office Visit on 02/16/2019  Component Date Value Ref Range Status  . Rubella Antibodies, IGG 02/16/2019 4.25  Immune >0.99 index Final   Comment:                                 Non-immune       <0.90                                 Equivocal  0.90 - 0.99                                 Immune           >0.99   . WBC 02/16/2019 4.7  3.4 - 10.8 x10E3/uL Final  . RBC 02/16/2019 4.06  3.77 - 5.28 x10E6/uL Final  . Hemoglobin 02/16/2019 12.2  11.1 - 15.9 g/dL Final  . Hematocrit 02/16/2019 38.6  34.0 - 46.6 % Final  . MCV 02/16/2019 95  79 - 97 fL Final  . MCH 02/16/2019 30.0  26.6 - 33.0 pg Final  . MCHC 02/16/2019 31.6  31.5 - 35.7 g/dL Final  . RDW 02/16/2019 11.8  11.7 -  15.4 % Final  . Platelets 02/16/2019 280  150 - 450 x10E3/uL Final  . Neutrophils 02/16/2019 58  Not Estab. % Final  . Lymphs 02/16/2019 34  Not Estab. % Final  . Monocytes 02/16/2019 6  Not Estab. % Final  . Eos 02/16/2019 1  Not Estab. % Final  . Basos 02/16/2019 1  Not Estab. % Final  . Neutrophils Absolute 02/16/2019 2.7  1.4 - 7.0 x10E3/uL Final  . Lymphocytes Absolute 02/16/2019 1.6  0.7 - 3.1 x10E3/uL Final  . Monocytes Absolute 02/16/2019 0.3  0.1 - 0.9 x10E3/uL Final  . EOS (ABSOLUTE) 02/16/2019 0.0  0.0 - 0.4 x10E3/uL Final  . Basophils Absolute 02/16/2019 0.0  0.0 - 0.2 x10E3/uL Final  . Immature Granulocytes 02/16/2019 0  Not Estab. % Final  . Immature Grans (Abs) 02/16/2019 0.0  0.0 - 0.1 x10E3/uL Final  . Adequacy 02/16/2019 Satisfactory for evaluation  endocervical/transformation zone component PRESENT.   Final  . Diagnosis 02/16/2019 NEGATIVE FOR INTRAEPITHELIAL LESIONS OR MALIGNANCY.   Final  . HPV 02/16/2019 NOT DETECTED   Final   Normal Reference Range - NOT Detected  . Material Submitted 02/16/2019 CervicoVaginal Pap [ThinPrep Imaged]   Final    (this displays the last labs from the last 3 days)  No results found for: TOTALPROTELP, ALBUMINELP, A1GS, A2GS, BETS, BETA2SER, GAMS, MSPIKE, SPEI (this displays SPEP labs)  No results found for: KPAFRELGTCHN, LAMBDASER, KAPLAMBRATIO (kappa/lambda light chains)  No results found for: HGBA, HGBA2QUANT, HGBFQUANT, HGBSQUAN (Hemoglobinopathy evaluation)   No results found for: LDH  No results found for: IRON, TIBC, IRONPCTSAT (Iron and TIBC)  No results found for: FERRITIN  Urinalysis No results found for: COLORURINE, APPEARANCEUR, LABSPEC, PHURINE, GLUCOSEU, HGBUR, BILIRUBINUR, KETONESUR, PROTEINUR, UROBILINOGEN, NITRITE, LEUKOCYTESUR   STUDIES:  No results found.   ELIGIBLE FOR AVAILABLE RESEARCH PROTOCOL: no   ASSESSMENT: 28 y.o. Debe Coder, [place] woman at high risk for breast cancer,  based chiefly on her  mother's diagnosis of breast cancer at age 76 and the patient's not having a child age 63--baseline Tyer Cusick's lifetime risk of breast cancer 25-28%    PLAN: We spent more than 50% of today's 50 minute appointment in counseling and coordination of care regarding the biology of the patient's diagnosis and the specifics of her situation. Stephanie Cruz understands she has been found to be at high risk for developing breast cancer based on non-modifiable risk factors. The [employer] evaluator predicts a c.25% risk of her developing breast cancer in her lifetime.  That of course means she has a 75% risk of NOT developing breast cancer.  There are 2 ways of dealing with this problem.  One is risk reduction.  The other one is intensified screening.  One way to reduce the risk is bilateral mastectomies.  We discussed and if she were to go this way, nipple sparing mastectomy with implant reconstruction might be an optimal choice.  Bilateral mastectomies does not entirely eliminate the risk of breast cancer but would reduce it to less than 5% over the patient's lifetime. Note there is no difference in survival--appropriately screened women who keep their breasts will live just as long. For these reasons, and because of the cost, trauma, and body image changes associated with this surgery we generally reserve it for special cases.  Another approach to risk reduction is antiestrogens.  Aromatase inhibitors [anastrozole, letrozole and exemestane] can be used in post-menopausal patients. Tamoxifen or raloxefene can be used in pre- or post-menopausal patients.   We discussed these agents in detail including their possible toxicities, side effects and complications.  We also discussed the fact that either of these classes of drugs taken for 5 years would cut her risk of breast cancer in half.  Given their low cost, effectiveness and generally favorable side effect profile, this is a frequent choice for patients seeking  to reduce their breast cancer risk. And of course if a patient tries tone of these drugs and develops side effects she can simply stop the agent with no permanent effects on her system.  A third way to reduce her cancer risk--not specific to breast cancer--is to optimize her diet and exercise routine.  Ideally she would be on a mostly vegetable diet with limited carbohydrates. Meats and dairy are allowed. The exercise goal is 45 minutes 5 times a week of activity vigorous enough to induce perspiration. This would reduce her risk of any cancer developing by between 1 and 3%.    After discussing the options for risk reduction, we discussed intensified screening.  This would include yearly mammography with tomography, biannual breast exams by an MD, and a yearly breast MRI.  Stephanie Cruz understands that adding the MRI may increase cost and also put her at risk for false positives, which may lead to unnecessary procedures.  The point of course would be to find any cancer that may develop at the very earliest stages to increase the chance of cure with minimal interventions (specifically without chemotherapy).  While Stephanie Cruz is not ready to make a definitive choice today, she is most interested in tamoxifen and intensified screening. Because of her family plans, a good time to consider starting this would be when she turns 42. She will let me know at that time and we will set her up for initial mammography (10 years before her mother's age at initial diagnosis). She may consider tamoxifen at that time as well  Incidentally she showed me  a copy of her mother's pathology. She had a grade 3 invasive ductal carcinoma which was estrogen and progesterone receptor negative, but HER-2 amplified.  My understanding is that HER-2 positive breast cancers tend to be sporadic and not involved in clear heritability patterns, but I will have to do more reading regarding this.  It may be that her risk of breast cancer is accordingly  lower than predicted.  Stephanie Cruz has a good understanding of the overall plan. She agrees with it. She knows the goal of treatment in her case is prevention. She will call with any problems that may develop before her next visit here.     Mallisa Alameda, Virgie Dad, MD  08/17/19 5:12 PM Medical Oncology and Hematology [facility] [address] [place], [place] [address] Tel. [phone]    Fax. [phone]   I, Jacqualyn Posey am acting as a Education administrator for Chauncey Cruel, MD.   I, Lurline Del MD, have reviewed the above documentation for accuracy and completeness, and I agree with the above.

## 2019-08-17 ENCOUNTER — Inpatient Hospital Stay: Payer: 59 | Attending: Genetic Counselor | Admitting: Oncology

## 2019-08-17 ENCOUNTER — Other Ambulatory Visit: Payer: Self-pay

## 2019-08-17 VITALS — BP 111/58 | HR 74 | Temp 99.1°F | Resp 18 | Ht 65.5 in | Wt 148.1 lb

## 2019-08-17 DIAGNOSIS — F419 Anxiety disorder, unspecified: Secondary | ICD-10-CM | POA: Insufficient documentation

## 2019-08-17 DIAGNOSIS — Z1239 Encounter for other screening for malignant neoplasm of breast: Secondary | ICD-10-CM | POA: Diagnosis not present

## 2019-08-17 DIAGNOSIS — Z803 Family history of malignant neoplasm of breast: Secondary | ICD-10-CM | POA: Insufficient documentation

## 2019-08-17 DIAGNOSIS — Z1379 Encounter for other screening for genetic and chromosomal anomalies: Secondary | ICD-10-CM

## 2019-08-17 DIAGNOSIS — Z79899 Other long term (current) drug therapy: Secondary | ICD-10-CM | POA: Insufficient documentation

## 2019-11-14 DIAGNOSIS — Z20828 Contact with and (suspected) exposure to other viral communicable diseases: Secondary | ICD-10-CM | POA: Diagnosis not present

## 2019-11-25 ENCOUNTER — Encounter: Payer: Self-pay | Admitting: *Deleted

## 2019-11-25 ENCOUNTER — Ambulatory Visit (INDEPENDENT_AMBULATORY_CARE_PROVIDER_SITE_OTHER): Payer: 59 | Admitting: *Deleted

## 2019-11-25 ENCOUNTER — Other Ambulatory Visit: Payer: Self-pay

## 2019-11-25 DIAGNOSIS — Z3201 Encounter for pregnancy test, result positive: Secondary | ICD-10-CM | POA: Diagnosis not present

## 2019-11-25 DIAGNOSIS — Z3200 Encounter for pregnancy test, result unknown: Secondary | ICD-10-CM

## 2019-11-25 LAB — POCT URINE PREGNANCY: Preg Test, Ur: POSITIVE — AB

## 2019-11-25 NOTE — Progress Notes (Signed)
Pt here for pregnancy confirmation which was positive.  Her LMP was 10/19/19 and she is scheduled for NOB in Jan

## 2019-12-06 ENCOUNTER — Telehealth: Payer: Self-pay | Admitting: Obstetrics and Gynecology

## 2019-12-06 DIAGNOSIS — L700 Acne vulgaris: Secondary | ICD-10-CM | POA: Diagnosis not present

## 2019-12-06 NOTE — Telephone Encounter (Signed)
Left message on voicemail to call and reschedule cancelled appointment. °

## 2019-12-23 NOTE — L&D Delivery Note (Signed)
OB/GYN Faculty Practice Delivery Note  Stephanie Cruz is a 29 y.o. now G1P0000 s/p SVD at [redacted]w[redacted]d who was admitted for SOL.   ROM: 12h 62m with clear fluid GBS Status: Negative Maximum Maternal Temperature: 98.7  Delivery Note At 10:12 PM a viable female was delivered via Vaginal, Spontaneous (Presentation: Left Occiput Anterior).  APGAR: 8, 9; weight 8 lbs 10.5 oz (3926 gm).   Placenta status: Spontaneous, Intact.  Cord: 3 vessels with the following complications: None.  Cord pH: N/A  Anesthesia: Epidural Episiotomy: none Lacerations: 1st degree;Vaginal; bilateral periurethral hemastatic tears (not repaired) Suture Repair: 3.0 vicryl rapide Est. Blood Loss (mL): 347  Postpartum Planning [x]  Mom to postpartum.  Baby to Couplet care / Skin to Skin. [x]  plans to breast feed [x]  message to sent to schedule follow-up  [x]  vaccines UTD  , MSN, CNM 07/27/20, 11:48 PM

## 2019-12-25 ENCOUNTER — Telehealth: Payer: 59 | Admitting: Physician Assistant

## 2019-12-25 DIAGNOSIS — R197 Diarrhea, unspecified: Secondary | ICD-10-CM

## 2019-12-25 NOTE — Progress Notes (Signed)
We are sorry that you are not feeling well.  Here is how we plan to help!  Based on what you have shared with me it looks like you have Acute Infectious Diarrhea.  Most cases of acute diarrhea are due to infections with virus and bacteria and are self-limited conditions lasting less than 14 days.  Take two tablet now and then one after each loose stool up to 6 a day.  Antibiotics are not needed for most people with diarrhea.  It is good that things slowed down and this should only continue over the next 24-48 hours. The main thing is for you to stay hydrated. If you become unable to keep in fluids, you need to be seen in the ER. Please call your GYN office later today (on-call nurse) or at least by tomorrow morning to update them and to get further instruction.   HOME CARE  We recommend changing your diet to help with your symptoms for the next few days.  Drink plenty of fluids that contain water salt and sugar. Sports drinks such as Gatorade may help.   You may try broths, soups, bananas, applesauce, soft breads, mashed potatoes or crackers.   You are considered infectious for as long as the diarrhea continues. Hand washing or use of alcohol based hand sanitizers is recommend.  It is best to stay out of work or school until your symptoms stop.   GET HELP RIGHT AWAY  If you have dark yellow colored urine or do not pass urine frequently you should drink more fluids.    If your symptoms worsen   If you feel like you are going to pass out (faint)  You have a new problem  MAKE SURE YOU   Understand these instructions.  Will watch your condition.  Will get help right away if you are not doing well or get worse.  Your e-visit answers were reviewed by a board certified advanced clinical practitioner to complete your personal care plan.  Depending on the condition, your plan could have included both over the counter or prescription medications.  If there is a problem please reply   once you have received a response from your provider.  Your safety is important to [place].  If you have drug allergies check your prescription carefully.    You can use MyChart to ask questions about today's visit, request a non-urgent call back, or ask for a work or school excuse for 24 hours related to this e-Visit. If it has been greater than 24 hours you will need to follow up with your provider, or enter a new e-Visit to address those concerns.   You will get an e-mail in the next two days asking about your experience.  I hope that your e-visit has been valuable and will speed your recovery. Thank you for using e-visits.

## 2019-12-25 NOTE — Progress Notes (Signed)
I have spent 5 minutes in review of e-visit questionnaire, review and updating patient chart, medical decision making and response to patient.   Traeson Dusza Cody Tullio Chausse, PA-C    

## 2019-12-27 ENCOUNTER — Encounter: Payer: Self-pay | Admitting: *Deleted

## 2019-12-28 ENCOUNTER — Ambulatory Visit (INDEPENDENT_AMBULATORY_CARE_PROVIDER_SITE_OTHER): Payer: 59 | Admitting: Obstetrics and Gynecology

## 2019-12-28 ENCOUNTER — Other Ambulatory Visit: Payer: Self-pay

## 2019-12-28 ENCOUNTER — Encounter: Payer: Self-pay | Admitting: Obstetrics and Gynecology

## 2019-12-28 DIAGNOSIS — Z3400 Encounter for supervision of normal first pregnancy, unspecified trimester: Secondary | ICD-10-CM | POA: Insufficient documentation

## 2019-12-28 DIAGNOSIS — Z362 Encounter for other antenatal screening follow-up: Secondary | ICD-10-CM

## 2019-12-28 DIAGNOSIS — Z113 Encounter for screening for infections with a predominantly sexual mode of transmission: Secondary | ICD-10-CM

## 2019-12-28 DIAGNOSIS — Z3401 Encounter for supervision of normal first pregnancy, first trimester: Secondary | ICD-10-CM | POA: Diagnosis not present

## 2019-12-28 DIAGNOSIS — Z3A10 10 weeks gestation of pregnancy: Secondary | ICD-10-CM

## 2019-12-28 MED ORDER — DOXYLAMINE-PYRIDOXINE 10-10 MG PO TBEC: 2.0000 | DELAYED_RELEASE_TABLET | Freq: Every day | ORAL | 1 refills | Status: DC

## 2019-12-28 MED FILL — DOXYLAMINE-PYRIDOXINE 10-10: 10-10 | 25 days supply | Qty: 100 | Fill #0

## 2019-12-28 NOTE — Progress Notes (Addendum)
History:   Stephanie Cruz is a 29 y.o. G1P0000 at 74w0dby LMP being seen today for her first obstetrical visit.  Her obstetrical history is significant for Family history of breast cancer; mother, LEEP.  Patient does intend to breast feed. Pregnancy history fully reviewed. Planned pregnancy.   Patient reports nausea, no vomiting.   HISTORY: OB History  Gravida Para Term Preterm AB Living  1 0 0 0 0 0  SAB TAB Ectopic Multiple Live Births  0 0 0 0 0    # Outcome Date GA Lbr Len/2nd Weight Sex Delivery Anes PTL Lv  1 Current             Last pap smear was done 01/2019 and was normal  Past Medical History:  Diagnosis Date  . Abnormal Pap smear of cervix 11/2016   [place]  Final LEEP pathology - LGSIL.  .Marland KitchenAnxiety    Not formally dx'd  . Dysmenorrhea   . Family history of breast cancer    Past Surgical History:  Procedure Laterality Date  . CERVICAL BIOPSY  W/ LOOP ELECTRODE EXCISION  11/2016   [place] [place]   Family History  Problem Relation Age of Onset  . Breast cancer Mother 415      Dec with Met.Breast Ca  . Migraines Mother   . Migraines Sister   . Migraines Brother   . Stroke Maternal Grandfather   . Diabetes Paternal Grandmother   . Diabetes Paternal Grandfather   . Leukemia Cousin 10       pat first cousin   Social History   Tobacco Use  . Smoking status: Never Smoker  . Smokeless tobacco: Never Used  Substance Use Topics  . Alcohol use: Yes    Alcohol/week: 1.0 standard drinks    Types: 1 Glasses of wine per week  . Drug use: Never   No Known Allergies Current Outpatient Medications on File Prior to Visit  Medication Sig Dispense Refill  . Biotin 1000 MCG CHEW Chew 1 tablet by mouth daily.    . cetirizine (ZYRTEC) 10 MG tablet Take 1 tablet by mouth as needed.    . cholecalciferol (VITAMIN D3) 25 MCG (1000 UT) tablet Take 5,000 Units by mouth daily.    . Docosahexaenoic Acid (PRENATAL DHA) 200  MG CAPS      No current facility-administered medications on file prior to visit.    Review of Systems Pertinent items noted in HPI and remainder of comprehensive ROS otherwise negative. Physical Exam:   Vitals:   12/28/19 0825  BP: (!) 100/54  Pulse: (!) 56  Temp: 98.4 F (36.9 C)  Weight: 140 lb (63.5 kg)   Fetal Heart Rate (bpm): pos System: General: well-developed, well-nourished female in no acute distress   Breasts:  normal appearance, no masses or tenderness bilaterally   Skin: normal coloration and turgor, no rashes   Neurologic: oriented, normal, negative, normal mood   Extremities: normal strength, tone, and muscle mass, ROM of all joints is normal   HEENT PERRLA, extraocular movement intact and sclera clear, anicteric   Mouth/Teeth mucous membranes moist, pharynx normal without lesions and dental hygiene good   Neck supple and no masses   Cardiovascular: regular rate and rhythm   Respiratory:  no respiratory distress, normal breath sounds   Abdomen: soft, non-tender; bowel sounds normal; no masses,  no organomegaly  Bedside Ultrasound for FHR check: Patient informed that  the ultrasound is considered a limited obstetric ultrasound and is not intended to be a complete ultrasound exam.  Patient also informed that the ultrasound is not being completed with the intent of assessing for fetal or placental anomalies or any pelvic abnormalities.  Explained that the purpose of today's ultrasound is to assess for fetal heart rate.  Patient acknowledges the purpose of the exam and the limitations of the study.     Assessment:    Pregnancy: G1P0000 Patient Active Problem List   Diagnosis Date Noted  . Supervision of normal first pregnancy 12/28/2019  . Breast cancer screening, high risk patient 08/17/2019  . Genetic testing 07/05/2019  . Family history of breast cancer      Plan:   1. Encounter for supervision of normal first pregnancy in first trimester  - Obstetric  panel - HIV antibody (with reflex) - Culture, OB Urine - Babyscripts Schedule Optimization - Urine cytology ancillary only([place]) - TSH *Return for NIPS. AFP after 16 weeks. [place] ordered.  -Some nausea, no vomiting. Rx: Diclegis.   Initial labs drawn. Continue prenatal vitamins. Genetic Screening discussed, NIPS: requested. Ultrasound discussed; fetal anatomic survey: requested. Problem list reviewed and updated. The nature of [place] with multiple MDs and other Advanced Practice Providers was explained to patient; also emphasized that residents, students are part of our team. Routine obstetric precautions reviewed.    , Artist Pais, [place] for [employer], [place]

## 2019-12-28 NOTE — Progress Notes (Signed)
Bedside U/S shows single IUP with positive FHT and CRL measures 33.82mm  GA [redacted]w[redacted]d

## 2019-12-29 LAB — HIV ANTIBODY (ROUTINE TESTING W REFLEX): HIV 1&2 Ab, 4th Generation: NONREACTIVE

## 2019-12-29 LAB — TSH: TSH: 1.23 mIU/L

## 2019-12-29 LAB — OBSTETRIC PANEL
Absolute Monocytes: 462 cells/uL (ref 200–950)
Antibody Screen: NOT DETECTED
Basophils Absolute: 32 cells/uL (ref 0–200)
Basophils Relative: 0.4 %
Eosinophils Absolute: 8 cells/uL — ABNORMAL LOW (ref 15–500)
Eosinophils Relative: 0.1 %
HCT: 35.9 % (ref 35.0–45.0)
Hemoglobin: 12.1 g/dL (ref 11.7–15.5)
Hepatitis B Surface Ag: NONREACTIVE
Lymphs Abs: 1013 cells/uL (ref 850–3900)
MCH: 31.4 pg (ref 27.0–33.0)
MCHC: 33.7 g/dL (ref 32.0–36.0)
MCV: 93.2 fL (ref 80.0–100.0)
MPV: 10.5 fL (ref 7.5–12.5)
Monocytes Relative: 5.7 %
Neutro Abs: 6585 cells/uL (ref 1500–7800)
Neutrophils Relative %: 81.3 %
Platelets: 283 10*3/uL (ref 140–400)
RBC: 3.85 10*6/uL (ref 3.80–5.10)
RDW: 12.4 % (ref 11.0–15.0)
RPR Ser Ql: NONREACTIVE
Rubella: 4.19 Index
Total Lymphocyte: 12.5 %
WBC: 8.1 10*3/uL (ref 3.8–10.8)

## 2019-12-29 LAB — URINE CYTOLOGY ANCILLARY ONLY
Chlamydia: NEGATIVE
Comment: NEGATIVE
Comment: NORMAL
Neisseria Gonorrhea: NEGATIVE

## 2020-01-01 LAB — CULTURE, OB URINE

## 2020-01-01 LAB — URINE CULTURE, OB REFLEX

## 2020-01-11 ENCOUNTER — Other Ambulatory Visit: Payer: Self-pay

## 2020-01-11 ENCOUNTER — Ambulatory Visit (INDEPENDENT_AMBULATORY_CARE_PROVIDER_SITE_OTHER): Payer: 59

## 2020-01-11 DIAGNOSIS — Z31430 Encounter of female for testing for genetic disease carrier status for procreative management: Secondary | ICD-10-CM | POA: Diagnosis not present

## 2020-01-11 DIAGNOSIS — Z36 Encounter for antenatal screening for chromosomal anomalies: Secondary | ICD-10-CM | POA: Diagnosis not present

## 2020-01-11 DIAGNOSIS — Z3401 Encounter for supervision of normal first pregnancy, first trimester: Secondary | ICD-10-CM | POA: Diagnosis not present

## 2020-01-11 DIAGNOSIS — Z3400 Encounter for supervision of normal first pregnancy, unspecified trimester: Secondary | ICD-10-CM

## 2020-01-11 NOTE — Progress Notes (Signed)
Pt here for Panorama. 

## 2020-01-20 ENCOUNTER — Encounter: Payer: Self-pay | Admitting: *Deleted

## 2020-01-20 DIAGNOSIS — Z3400 Encounter for supervision of normal first pregnancy, unspecified trimester: Secondary | ICD-10-CM

## 2020-01-23 ENCOUNTER — Other Ambulatory Visit: Payer: Self-pay | Admitting: Obstetrics and Gynecology

## 2020-01-23 MED ORDER — PROMETHAZINE HCL 25 MG PO TABS: 25.0000 mg | ORAL_TABLET | Freq: Four times a day (QID) | ORAL | 1 refills | Status: DC | PRN

## 2020-01-23 MED ORDER — ONDANSETRON 8 MG PO TBDP: 8.0000 mg | ORAL_TABLET | Freq: Three times a day (TID) | ORAL | 1 refills | Status: DC | PRN

## 2020-01-23 MED FILL — PROMETHAZINE 25 MG TABLET: 25 | 8 days supply | Qty: 30 | Fill #0

## 2020-01-23 MED FILL — ONDANSETRON ODT 8 MG TABLET: 8 | 10 days supply | Qty: 30 | Fill #0

## 2020-01-23 NOTE — Progress Notes (Signed)
Revonda Standard called requesting additional N/v medication. Rx: Phenergan, Zofran   Venia Carbon I, NP 01/23/2020 10:35 AM

## 2020-01-30 ENCOUNTER — Other Ambulatory Visit: Payer: Self-pay

## 2020-01-30 ENCOUNTER — Ambulatory Visit: Payer: 59 | Admitting: *Deleted

## 2020-01-30 DIAGNOSIS — Z3400 Encounter for supervision of normal first pregnancy, unspecified trimester: Secondary | ICD-10-CM

## 2020-01-30 NOTE — Progress Notes (Signed)
Pt here for NIPT redraw due to low fetal fraction.  No charge

## 2020-02-08 ENCOUNTER — Encounter: Payer: Self-pay | Admitting: Nurse Practitioner

## 2020-02-08 ENCOUNTER — Ambulatory Visit (INDEPENDENT_AMBULATORY_CARE_PROVIDER_SITE_OTHER): Payer: 59 | Admitting: Nurse Practitioner

## 2020-02-08 ENCOUNTER — Other Ambulatory Visit: Payer: Self-pay

## 2020-02-08 DIAGNOSIS — Z3402 Encounter for supervision of normal first pregnancy, second trimester: Secondary | ICD-10-CM | POA: Diagnosis not present

## 2020-02-08 DIAGNOSIS — Z3A16 16 weeks gestation of pregnancy: Secondary | ICD-10-CM

## 2020-02-08 DIAGNOSIS — Z3400 Encounter for supervision of normal first pregnancy, unspecified trimester: Secondary | ICD-10-CM | POA: Diagnosis not present

## 2020-02-08 NOTE — Progress Notes (Signed)
    Subjective:  Stephanie Cruz is a 29 y.o. G1P0000 at [redacted]w[redacted]d being seen today for ongoing prenatal care.  She is currently monitored for the following issues for this low-risk pregnancy and has Family history of breast cancer; Genetic testing; Breast cancer screening, high risk patient; and Supervision of normal first pregnancy on their problem list.  Patient reports no complaints.   .  .   . Denies leaking of fluid.   The following portions of the patient's history were reviewed and updated as appropriate: allergies, current medications, past family history, past medical history, past social history, past surgical history and problem list. Problem list updated.  Objective:   Vitals:   02/08/20 0814  BP: 109/66  Pulse: 63  Temp: 98.3 F (36.8 C)  Weight: 145 lb (65.8 kg)    Fetal Status: Fetal Heart Rate (bpm): 143 Fundal Height: 16 cm       General:  Alert, oriented and cooperative. Patient is in no acute distress.  Skin: Skin is warm and dry. No rash noted.   Cardiovascular: Normal heart rate noted  Respiratory: Normal respiratory effort, no problems with respiration noted  Abdomen: Soft, gravid, appropriate for gestational age.       Pelvic:  Cervical exam deferred        Extremities: Normal range of motion.     Mental Status: Normal mood and affect. Normal behavior. Normal judgment and thought content.   Urinalysis:      Assessment and Plan:  Pregnancy: G1P0000 at [redacted]w[redacted]d  1. Supervision of normal first pregnancy, antepartum Discussed option of getting Covid vaccine - will likely choose to get vaccine in a few weeks.  Wanted to wait until 3rd trimester. Reviewed online childbirth and breastfeeding classes Nausea has resolved. Encouraged to enter BP in babyscripts weekly  - Alpha fetoprotein, maternal  Preterm labor symptoms and general obstetric precautions including but not limited to vaginal bleeding, contractions, leaking of fluid and fetal movement were  reviewed in detail with the patient. Please refer to After Visit Summary for other counseling recommendations.  Return in about 4 weeks (around 03/07/2020) for virtual ROB.  Nolene Bernheim, RN, MSN, NP-BC Nurse Practitioner, [facility] for [employer], [facility] 02/08/2020 11:24 AM

## 2020-02-08 NOTE — Patient Instructions (Signed)

## 2020-02-13 LAB — ALPHA FETOPROTEIN, MATERNAL
AFP MoM: 1.55
AFP, Serum: 52.3 ng/mL
Calc'd Gestational Age: 16 weeks
Maternal Wt: 145 [lb_av]
Risk for ONTD: 1
Twins-AFP: 1

## 2020-02-13 LAB — TIQ-AOE

## 2020-02-20 ENCOUNTER — Ambulatory Visit: Payer: 59 | Admitting: Obstetrics and Gynecology

## 2020-02-29 ENCOUNTER — Ambulatory Visit (HOSPITAL_COMMUNITY)
Admission: RE | Admit: 2020-02-29 | Discharge: 2020-02-29 | Disposition: A | Discharge status: Home / Self Care | Payer: 59 | Source: Ambulatory Visit | Attending: Obstetrics and Gynecology | Admitting: Obstetrics and Gynecology

## 2020-02-29 ENCOUNTER — Other Ambulatory Visit: Payer: Self-pay

## 2020-02-29 DIAGNOSIS — Z3401 Encounter for supervision of normal first pregnancy, first trimester: Secondary | ICD-10-CM | POA: Insufficient documentation

## 2020-02-29 DIAGNOSIS — Z363 Encounter for antenatal screening for malformations: Secondary | ICD-10-CM | POA: Diagnosis not present

## 2020-02-29 DIAGNOSIS — Z3A19 19 weeks gestation of pregnancy: Secondary | ICD-10-CM

## 2020-03-08 ENCOUNTER — Telehealth (INDEPENDENT_AMBULATORY_CARE_PROVIDER_SITE_OTHER): Payer: 59 | Admitting: Obstetrics & Gynecology

## 2020-03-08 ENCOUNTER — Other Ambulatory Visit: Payer: Self-pay

## 2020-03-08 DIAGNOSIS — Z3400 Encounter for supervision of normal first pregnancy, unspecified trimester: Secondary | ICD-10-CM

## 2020-03-08 NOTE — Patient Instructions (Signed)
Return to office for any scheduled appointments. Call the office or go to the MAU at [facility] [place] if:  You begin to have strong, frequent contractions  Your water breaks.  Sometimes it is a big gush of fluid, sometimes it is just a trickle that keeps getting your panties wet or running down your legs  You have vaginal bleeding.  It is normal to have a small amount of spotting if your cervix was checked.   You do not feel your baby moving like normal.  If you do not, get something to eat and drink and lay down and focus on feeling your baby move.   If your baby is still not moving like normal, you should call the office or go to MAU.  Any other obstetric concerns.   

## 2020-03-08 NOTE — Progress Notes (Signed)
OBSTETRICS PRENATAL VIRTUAL VISIT ENCOUNTER NOTE  Provider location: Center for [facility] [place]   I connected with Stephanie Cruz on 03/08/20 at  2:30 PM EDT by MyChart Video Encounter at home and verified that I am speaking with the correct person using two identifiers.   I discussed the limitations, risks, security and privacy concerns of performing an evaluation and management service virtually and the availability of in person appointments. I also discussed with the patient that there may be a patient responsible charge related to this service. The patient expressed understanding and agreed to proceed. Subjective:  Stephanie Cruz is a 29 y.o. G1P0000 at [redacted]w[redacted]d being seen today for ongoing prenatal care.  She is currently monitored for the following issues for this low-risk pregnancy and has Family history of breast cancer; Genetic testing; Breast cancer screening, high risk patient; and Supervision of normal first pregnancy on their problem list.  Patient reports no complaints.   . Vag. Bleeding: None.  Movement: Absent. Denies any leaking of fluid.   The following portions of the patient's history were reviewed and updated as appropriate: allergies, current medications, past family history, past medical history, past social history, past surgical history and problem list.   Objective:  There were no vitals filed for this visit.  Fetal Status:     Movement: Absent     General:  Alert, oriented and cooperative. Patient is in no acute distress.  Respiratory: Normal respiratory effort, no problems with respiration noted  Mental Status: Normal mood and affect. Normal behavior. Normal judgment and thought content.  Rest of physical exam deferred due to type of encounter  Imaging: [place] MFM OB COMP + 14 WK  Result Date: 02/29/2020 ----------------------------------------------------------------------  OBSTETRICS REPORT                       (Signed Final  02/29/2020 09:30 am) ---------------------------------------------------------------------- Patient Info  ID #:       [id number]                          D.O.B.:  1991/04/07 (29 yrs)  Name:       Stephanie Cruz             Visit Date: 02/29/2020 09:10 am ---------------------------------------------------------------------- Performed By  Performed By:     Berlinda Last          Ref. Address:     [address] [place],                                                              [address]  Attending:        Johnell Comings MD         [facility]  Referred By:      Duane Lope NP ---------------------------------------------------------------------- Orders   #  Description                          Code         Ordered By   1  [place] MFM OB COMP + 14 WK               76805.01     JENNIFER RASCH  ----------------------------------------------------------------------   #  Order #                    Accession #                 Episode #   1  [id number]                  [id number]                  [id number]  ---------------------------------------------------------------------- Indications   Encounter for antenatal screening for          Z36.3   malformations (low risk NIPS)   [redacted] weeks gestation of pregnancy                Z3A.19  ---------------------------------------------------------------------- Fetal Evaluation  Num Of Fetuses:         1  Fetal Heart Rate(bpm):  146  Cardiac Activity:       Observed  Presentation:           Breech  Placenta:               Anterior  P. Cord Insertion:      Visualized  Amniotic Fluid  AFI FV:      Within normal limits ---------------------------------------------------------------------- Biometry  BPD:        45  mm     G. Age:  19w 4d         76  %    CI:        72.72   %    70 -  86                                                          FL/HC:      19.1   %    16.1 - 18.3  HC:      167.8  mm     G. Age:  19w 3d         64  %    HC/AC:      1.16        1.09 - 1.39  AC:       145   mm     G. Age:  19w 6d  73  %    FL/BPD:     71.1   %  FL:         32  mm     G. Age:  20w 0d         77  %    FL/AC:      22.1   %    20 - 24  HUM:      29.4  mm     G. Age:  19w 4d         66  %  CER:      20.1  mm     G. Age:  19w 1d         52  %  Est. FW:     316  gm    0 lb 11 oz      89  % ---------------------------------------------------------------------- OB History  Gravidity:    1         Term:   0        Prem:   0        SAB:   0  TOP:          0       Ectopic:  0        Living: 0 ---------------------------------------------------------------------- Gestational Age  LMP:           19w 0d        Date:  10/19/19                 EDD:   07/25/20  U/S Today:     19w 5d                                        EDD:   07/20/20  Best:          19w 0d     Det. By:  LMP  (10/19/19)          EDD:   07/25/20 ---------------------------------------------------------------------- Anatomy  Cranium:               Appears normal         Aortic Arch:            Appears normal  Cavum:                 Appears normal         Ductal Arch:            Appears normal  Ventricles:            Appears normal         Diaphragm:              Appears normal  Choroid Plexus:        Appears normal         Stomach:                Appears normal, left                                                                        sided  Cerebellum:  Appears normal         Abdomen:                Appears normal  Posterior Fossa:       Appears normal         Abdominal Wall:         Appears nml (cord                                                                        insert, abd wall)  Face:                  Appears normal         Cord Vessels:           Appears normal (3                         (orbits and profile)                            vessel cord)  Lips:                  Appears normal         Kidneys:                Appear normal  Thoracic:              Appears normal         Bladder:                Appears normal  Heart:                 Appears normal         Spine:                  Appears normal                         (4CH, axis, and                         situs)  RVOT:                  Appears normal         Upper Extremities:      Appears normal  LVOT:                  Appears normal         Lower Extremities:      Appears normal  Other:  Heels and right 5th digit visualized. 3VV and 3VTV visualized. Nasal          bone visualized. ---------------------------------------------------------------------- Cervix Uterus Adnexa  Cervix  Length:            4.5  cm.  Normal appearance by transabdominal scan.  Adnexa  No abnormality visualized. ---------------------------------------------------------------------- Comments  This patient was seen for a detailed fetal anatomy scan. She  denies any significant past medical history and denies any  problems in her current pregnancy.  She had a cell free DNA test earlier in her  pregnancy which  indicated a low risk for trisomy 61, 29, and 65. A female fetus is  predicted.  She was informed that the fetal growth and amniotic fluid  level were appropriate for her gestational age.  There were no obvious fetal anomalies noted on today's  ultrasound exam.  The patient was informed that anomalies may be missed due  to technical limitations. If the fetus is in a suboptimal position  or maternal habitus is increased, visualization of the fetus in  the maternal uterus may be impaired.  Follow up as indicated. ----------------------------------------------------------------------                   Ma Rings, MD Electronically Signed Final Report   02/29/2020 09:30 am ----------------------------------------------------------------------   Assessment and Plan:  Pregnancy: G1P0000 at  [redacted]w[redacted]d Supervision of normal first pregnancy, antepartum Normal anatomy scan reviewed with patient. All questions answered. No other complaints or concerns.  Routine obstetric precautions reviewed.  I discussed the assessment and treatment plan with the patient. The patient was provided an opportunity to ask questions and all were answered. The patient agreed with the plan and demonstrated an understanding of the instructions. The patient was advised to call back or seek an in-person office evaluation/go to MAU at [facility] for any urgent or concerning symptoms. Please refer to After Visit Summary for other counseling recommendations.   I provided 15 minutes of face-to-face time during this encounter.  Return in about 4 weeks (around 04/05/2020) for Virtual OB Visit//8 weeks:  2 hr GTT, 3rd trimester labs, TDap, OFFICE OB Visit.  Future Appointments  Date Time Provider Department Center  03/21/2020  8:30 AM [place] Illene Regulus, Forrestine Him, MD [facility] None    Jaynie Collins, MD Center for [facility], [facility]

## 2020-03-20 ENCOUNTER — Other Ambulatory Visit: Payer: Self-pay

## 2020-03-20 NOTE — Progress Notes (Signed)
Opened in error

## 2020-03-21 ENCOUNTER — Encounter: Payer: Self-pay | Admitting: Obstetrics and Gynecology

## 2020-03-21 ENCOUNTER — Encounter: Payer: 59 | Admitting: Obstetrics and Gynecology

## 2020-03-27 ENCOUNTER — Telehealth: Payer: Self-pay | Admitting: *Deleted

## 2020-03-27 NOTE — Telephone Encounter (Signed)
Patient is in 08 recall for 02/21 due to history of LEEP outside office 2017. Patient is currently pregnant and being followed by OB. Routing to provider to review and advise on recall status.   Routing to provider for review.

## 2020-03-28 NOTE — Telephone Encounter (Signed)
Spoke with pt. Pt states had repeat pap in 02/16/2019 with Dr Edward Jolly that was normal and negative. Pt states wasn't aware to come back for another pap in 1 year, was told at new OB visit, did not need pap for 3 years. Pt states did not have pap with OB with current pregnancy. Routing to provider for review and advice.   Routing to Dr Edward Jolly.    Patton Salles, MD  02/22/2019 9:37 AM EST    Results to patient through My Chart. Pap recall - 08. Hx LEEP at outside office in 2017.  Hello Stephanie Cruz,   I am sharing your test results from your recent visit.   Your pap and high risk HPV testing are normal and negative.

## 2020-03-28 NOTE — Telephone Encounter (Signed)
Please contact patient to see if she has had a pap in pregnancy.  If so, we can get a copy of her report.

## 2020-03-29 NOTE — Telephone Encounter (Signed)
Spoke with pt. Pt given recommendations per Dr Edward Jolly for repeat pap smear this year. Pt agreeable and will call OB to obtain pap during either pregnancy visits or postpartum. Pt aware to call our office to set up repeat pap if not done through OB after final postpartum visit.  Pt verbalized understanding. Recall placed for Jan 2022.  Routing to Dr Edward Jolly for review.  Encounter closed.

## 2020-03-29 NOTE — Telephone Encounter (Signed)
There is no current guideline with ASCCP for her circumstance with her prior history and then her last pap.  Therefore, I would follow the old guidelines, and have her repeat a pap this year sometime.  If she does not do this at her postpartum visit, have her return her to see me for a pap after she sees her OB for the final postpartum visit.  Please place her in recall for Jan. 2022.

## 2020-04-06 ENCOUNTER — Telehealth (INDEPENDENT_AMBULATORY_CARE_PROVIDER_SITE_OTHER): Payer: 59 | Admitting: Certified Nurse Midwife

## 2020-04-06 DIAGNOSIS — Z3403 Encounter for supervision of normal first pregnancy, third trimester: Secondary | ICD-10-CM

## 2020-04-06 NOTE — Progress Notes (Signed)
   OBSTETRICS PRENATAL VIRTUAL VISIT ENCOUNTER NOTE  Provider location: Center for [facility] [place]   I connected with [name] on 04/06/20 at  8:10 AM EDT by MyChart Video Encounter at home and verified that I am speaking with the correct person using two identifiers.   I discussed the limitations, risks, security and privacy concerns of performing an evaluation and management service virtually and the availability of in person appointments. I also discussed with the patient that there may be a patient responsible charge related to this service. The patient expressed understanding and agreed to proceed. Subjective:  Stephanie Cruz is a 29 y.o. G1P0000 at [redacted]w[redacted]d being seen today for ongoing prenatal care.  She is currently monitored for the following issues for this low-risk pregnancy and has Family history of breast cancer; Genetic testing; Breast cancer screening, high risk patient; and Supervision of normal first pregnancy on their problem list.  Patient reports heartburn. She is using TUMS and drinking water which helps.  . Vag. Bleeding: None.  Movement: Present. Denies any leaking of fluid.   The following portions of the patient's history were reviewed and updated as appropriate: allergies, current medications, past family history, past medical history, past social history, past surgical history and problem list.   Objective:   Vitals:   04/06/20 0724  BP: 108/68  Pulse: 62  Weight: 156 lb (70.8 kg)    Fetal Status:     Movement: Present     General:  Alert, oriented and cooperative. Patient is in no acute distress.  Respiratory: Normal respiratory effort, no problems with respiration noted  Mental Status: Normal mood and affect. Normal behavior. Normal judgment and thought content.  Rest of physical exam deferred due to type of encounter  Imaging: No results found.  Assessment and Plan:  Pregnancy: G1P0000 at [redacted]w[redacted]d 1. Encounter for  supervision of normal first pregnancy in third trimester - GTT next visit - anticipatory guidance given  Preterm labor symptoms and general obstetric precautions including but not limited to vaginal bleeding, contractions, leaking of fluid and fetal movement were reviewed in detail with the patient. I discussed the assessment and treatment plan with the patient. The patient was provided an opportunity to ask questions and all were answered. The patient agreed with the plan and demonstrated an understanding of the instructions. The patient was advised to call back or seek an in-person office evaluation/go to MAU at [facility] for any urgent or concerning symptoms. Please refer to After Visit Summary for other counseling recommendations.   I provided 7 minutes of face-to-face time during this encounter.  No follow-ups on file.  Future Appointments  Date Time Provider Department Center  05/02/2020  8:10 AM Rasch, Harolyn Rutherford, NP [facility]    Donette Larry, CNM Center for [employer], [facility]

## 2020-04-29 ENCOUNTER — Encounter: Payer: Self-pay | Admitting: Obstetrics and Gynecology

## 2020-05-02 ENCOUNTER — Other Ambulatory Visit: Payer: Self-pay

## 2020-05-02 ENCOUNTER — Ambulatory Visit (INDEPENDENT_AMBULATORY_CARE_PROVIDER_SITE_OTHER): Payer: 59 | Admitting: Obstetrics and Gynecology

## 2020-05-02 VITALS — BP 101/61 | HR 63 | Temp 98.4°F | Wt 165.0 lb

## 2020-05-02 DIAGNOSIS — Z3402 Encounter for supervision of normal first pregnancy, second trimester: Secondary | ICD-10-CM

## 2020-05-02 DIAGNOSIS — R87618 Other abnormal cytological findings on specimens from cervix uteri: Secondary | ICD-10-CM

## 2020-05-02 DIAGNOSIS — Z23 Encounter for immunization: Secondary | ICD-10-CM | POA: Diagnosis not present

## 2020-05-02 DIAGNOSIS — R87619 Unspecified abnormal cytological findings in specimens from cervix uteri: Secondary | ICD-10-CM | POA: Insufficient documentation

## 2020-05-02 DIAGNOSIS — Z3A28 28 weeks gestation of pregnancy: Secondary | ICD-10-CM

## 2020-05-02 DIAGNOSIS — Z3403 Encounter for supervision of normal first pregnancy, third trimester: Secondary | ICD-10-CM

## 2020-05-02 NOTE — Progress Notes (Signed)
   PRENATAL VISIT NOTE  Subjective:  Stephanie Cruz is a 29 y.o. G1P0000 at [redacted]w[redacted]d being seen today for ongoing prenatal care.  She is currently monitored for the following issues for this low-risk pregnancy and has Family history of breast cancer; Genetic testing; Breast cancer screening, high risk patient; Supervision of normal first pregnancy; and Abnormal Pap smear of cervix on their problem list.  Patient reports no complaints.   . Vag. Bleeding: None.  Movement: Present. Denies leaking of fluid.   The following portions of the patient's history were reviewed and updated as appropriate: allergies, current medications, past family history, past medical history, past social history, past surgical history and problem list.   Objective:   Vitals:   05/02/20 0810  BP: 101/61  Pulse: 63  Temp: 98.4 F (36.9 C)  Weight: 165 lb (74.8 kg)    Fetal Status:   Fundal Height: 28 cm Movement: Present     General:  Alert, oriented and cooperative. Patient is in no acute distress.  Skin: Skin is warm and dry. No rash noted.   Cardiovascular: Normal heart rate noted  Respiratory: Normal respiratory effort, no problems with respiration noted  Abdomen: Soft, gravid, appropriate for gestational age.  Pain/Pressure: Absent     Pelvic: Cervical exam deferred        Extremities: Normal range of motion.  Edema: None  Mental Status: Normal mood and affect. Normal behavior. Normal judgment and thought content.   Assessment and Plan:  Pregnancy: G1P0000 at [redacted]w[redacted]d 1. Encounter for supervision of normal first pregnancy in second trimester  - 2Hr GTT w/ 1 Hr Carpenter 75 g - CBC - HIV antibody (with reflex) - RPR  Preterm labor symptoms and general obstetric precautions including but not limited to vaginal bleeding, contractions, leaking of fluid and fetal movement were reviewed in detail with the patient. Please refer to After Visit Summary for other counseling recommendations.   No  follow-ups on file.  Future Appointments  Date Time Provider Department Center  05/17/2020  1:45 PM Anyanwu, Jethro Bastos, MD [facility]    Venia Carbon, NP

## 2020-05-02 NOTE — Progress Notes (Signed)
Patient with Hx of abnormal pap. 05/2015: LSIL 08/2015: Colpo shows CIN I/ II,  LGSIL with mild dysplasia at least cells suspicious for high grade care also present  10/2015: LEEP: Excision margins negative. Focal mild squamous atpia insufficient for low grade squamous intraepithelial lesion.  2017: PAP normal  2018: PAP normal 2020: Pap normal  Per new ASCCP guidelines 3 year follow up is reasonable given 5 year risk of CIN3 at 0.35 % given the patient has 3 normal Paps in the last 3 years.    Discussed plan with patient.    Duane Lope, NP 05/02/2020 9:16 AM

## 2020-05-03 LAB — CBC
HCT: 32.6 % — ABNORMAL LOW (ref 35.0–45.0)
Hemoglobin: 10.8 g/dL — ABNORMAL LOW (ref 11.7–15.5)
MCH: 32.2 pg (ref 27.0–33.0)
MCHC: 33.1 g/dL (ref 32.0–36.0)
MCV: 97.3 fL (ref 80.0–100.0)
MPV: 10.7 fL (ref 7.5–12.5)
Platelets: 240 10*3/uL (ref 140–400)
RBC: 3.35 10*6/uL — ABNORMAL LOW (ref 3.80–5.10)
RDW: 11.8 % (ref 11.0–15.0)
WBC: 7.9 10*3/uL (ref 3.8–10.8)

## 2020-05-03 LAB — HIV ANTIBODY (ROUTINE TESTING W REFLEX): HIV 1&2 Ab, 4th Generation: NONREACTIVE

## 2020-05-03 LAB — RPR: RPR Ser Ql: NONREACTIVE

## 2020-05-03 LAB — 2HR GTT W 1 HR, CARPENTER, 75 G
Glucose, 1 Hr, Gest: 150 mg/dL (ref 65–179)
Glucose, 2 Hr, Gest: 118 mg/dL (ref 65–152)
Glucose, Fasting, Gest: 84 mg/dL (ref 65–91)

## 2020-05-17 ENCOUNTER — Encounter: Payer: Self-pay | Admitting: Obstetrics & Gynecology

## 2020-05-17 ENCOUNTER — Telehealth (INDEPENDENT_AMBULATORY_CARE_PROVIDER_SITE_OTHER): Payer: 59 | Admitting: Obstetrics & Gynecology

## 2020-05-17 DIAGNOSIS — Z3A30 30 weeks gestation of pregnancy: Secondary | ICD-10-CM

## 2020-05-17 DIAGNOSIS — Z3403 Encounter for supervision of normal first pregnancy, third trimester: Secondary | ICD-10-CM

## 2020-05-17 NOTE — Progress Notes (Signed)
   OBSTETRICS PRENATAL VIRTUAL VISIT ENCOUNTER NOTE  Provider location: Center for [facility] [place]   I connected with Stephanie Cruz on 05/17/20 at  1:45 PM EDT by MyChart Video Encounter at home and verified that I am speaking with the correct person using two identifiers.   I discussed the limitations, risks, security and privacy concerns of performing an evaluation and management service virtually and the availability of in person appointments. I also discussed with the patient that there may be a patient responsible charge related to this service. The patient expressed understanding and agreed to proceed. Subjective:  Stephanie Cruz is a 29 y.o. G1P0000 at [redacted]w[redacted]d being seen today for ongoing prenatal care.  She is currently monitored for the following issues for this low-risk pregnancy and has Family history of breast cancer; Genetic testing; Breast cancer screening, high risk patient; Supervision of normal first pregnancy; and Abnormal Pap smear of cervix on their problem list.  Patient reports no complaints.  Contractions: Not present. Vag. Bleeding: None.  Movement: Present. Denies any leaking of fluid.   The following portions of the patient's history were reviewed and updated as appropriate: allergies, current medications, past family history, past medical history, past social history, past surgical history and problem list.   Objective:   Vitals:   05/17/20 1352  BP: 108/68  Weight: 168 lb (76.2 kg)    Fetal Status:     Movement: Present     General:  Alert, oriented and cooperative. Patient is in no acute distress.  Respiratory: Normal respiratory effort, no problems with respiration noted  Mental Status: Normal mood and affect. Normal behavior. Normal judgment and thought content.  Rest of physical exam deferred due to type of encounter  Imaging: No results found.  Assessment and Plan:  Pregnancy: G1P0000 at [redacted]w[redacted]d  1. Encounter for  supervision of normal first pregnancy in third trimester Recommended iron supplements for Hgb 10.6.  Normal other labs. Preterm labor symptoms and general obstetric precautions including but not limited to vaginal bleeding, contractions, leaking of fluid and fetal movement were reviewed in detail with the patient. I discussed the assessment and treatment plan with the patient. The patient was provided an opportunity to ask questions and all were answered. The patient agreed with the plan and demonstrated an understanding of the instructions. The patient was advised to call back or seek an in-person office evaluation/go to MAU at [facility] for any urgent or concerning symptoms. Please refer to After Visit Summary for other counseling recommendations.   I provided 7 minutes of face-to-face time during this encounter.  Return in about 2 weeks (around 05/31/2020) for Virtual OB Visit.  No future appointments.  Jaynie Collins, MD Center for [employer], [facility]

## 2020-05-17 NOTE — Patient Instructions (Signed)
Return to office for any scheduled appointments. Call the office or go to the MA[facility] at [facility] [place] if:  You begin to have strong, frequent contractions  Your water breaks.  Sometimes it is a big gush of fluid, sometimes it is just a trickle that keeps getting your panties wet or running down your legs  You have vaginal bleeding.  It is normal to have a small amount of spotting if your cervix was checked.   You do not feel your baby moving like normal.  If you do not, get something to eat and drink and lay down and focus on feeling your baby move.   If your baby is still not moving like normal, you should call the office or go to MAU.  Any other obstetric concerns.   

## 2020-06-08 ENCOUNTER — Telehealth (INDEPENDENT_AMBULATORY_CARE_PROVIDER_SITE_OTHER): Payer: 59 | Admitting: Obstetrics and Gynecology

## 2020-06-08 DIAGNOSIS — Z3403 Encounter for supervision of normal first pregnancy, third trimester: Secondary | ICD-10-CM

## 2020-06-08 DIAGNOSIS — Z3A33 33 weeks gestation of pregnancy: Secondary | ICD-10-CM

## 2020-06-08 NOTE — Progress Notes (Signed)
   TELEHEALTH OBSTETRICS VISIT ENCOUNTER NOTE  I connected with Stephanie Cruz on 06/08/20 at 10:10 AM EDT by telephone at home and verified that I am speaking with the correct person using two identifiers.   I discussed the limitations, risks, security and privacy concerns of performing an evaluation and management service by telephone and the availability of in person appointments. I also discussed with the patient that there may be a patient responsible charge related to this service. The patient expressed understanding and agreed to proceed.  Subjective:  Stephanie Cruz is a 29 y.o. G1P0000 at [redacted]w[redacted]d being followed for ongoing prenatal care.  She is currently monitored for the following issues for this low-risk pregnancy and has Family history of breast cancer; Genetic testing; Breast cancer screening, high risk patient; Supervision of normal first pregnancy; and Abnormal Pap smear of cervix on their problem list.  Patient reports no complaints. Reports fetal movement. Denies any contractions, bleeding or leaking of fluid.   The following portions of the patient's history were reviewed and updated as appropriate: allergies, current medications, past family history, past medical history, past social history, past surgical history and problem list.   Objective:   General:  Alert, oriented and cooperative.   Mental Status: Normal mood and affect perceived. Normal judgment and thought content.  Rest of physical exam deferred due to type of encounter  Assessment and Plan:  Pregnancy: G1P0000 at [redacted]w[redacted]d  1. Encounter for supervision of normal first pregnancy in third trimester  BP 114/76 GBS next visit.    Preterm labor symptoms and general obstetric precautions including but not limited to vaginal bleeding, contractions, leaking of fluid and fetal movement were reviewed in detail with the patient.  I discussed the assessment and treatment plan with the patient. The patient was  provided an opportunity to ask questions and all were answered. The patient agreed with the plan and demonstrated an understanding of the instructions. The patient was advised to call back or seek an in-person office evaluation/go to MAU at [facility] for any urgent or concerning symptoms. Please refer to After Visit Summary for other counseling recommendations.   I provided 10 minutes of non-face-to-face time during this encounter.  Return in about 2 weeks (around 06/22/2020) for After 35 weeks for GBS. Marland Kitchen  No future appointments.  Venia Carbon, NP Center for [employer], [facility]

## 2020-06-22 ENCOUNTER — Encounter (INDEPENDENT_AMBULATORY_CARE_PROVIDER_SITE_OTHER): Payer: Self-pay

## 2020-07-03 ENCOUNTER — Ambulatory Visit (INDEPENDENT_AMBULATORY_CARE_PROVIDER_SITE_OTHER): Payer: 59 | Admitting: Advanced Practice Midwife

## 2020-07-03 ENCOUNTER — Other Ambulatory Visit (HOSPITAL_COMMUNITY)
Admission: RE | Admit: 2020-07-03 | Discharge: 2020-07-03 | Disposition: A | Discharge status: Home / Self Care | Payer: 59 | Source: Ambulatory Visit | Attending: Advanced Practice Midwife | Admitting: Advanced Practice Midwife

## 2020-07-03 ENCOUNTER — Other Ambulatory Visit: Payer: Self-pay

## 2020-07-03 VITALS — BP 114/58 | HR 80 | Wt 171.0 lb

## 2020-07-03 DIAGNOSIS — Z3480 Encounter for supervision of other normal pregnancy, unspecified trimester: Secondary | ICD-10-CM

## 2020-07-03 DIAGNOSIS — Z3A36 36 weeks gestation of pregnancy: Secondary | ICD-10-CM

## 2020-07-03 DIAGNOSIS — Z3483 Encounter for supervision of other normal pregnancy, third trimester: Secondary | ICD-10-CM

## 2020-07-03 LAB — OB RESULTS CONSOLE GBS: GBS: NEGATIVE

## 2020-07-03 NOTE — Progress Notes (Signed)
   PRENATAL VISIT NOTE  Subjective:  Gayla Benn is a 29 y.o. G1P0000 at [redacted]w[redacted]d being seen today for ongoing prenatal care.  She is currently monitored for the following issues for this low-risk pregnancy and has Family history of breast cancer; Genetic testing; Breast cancer screening, high risk patient; Supervision of normal first pregnancy; and Abnormal Pap smear of cervix on their problem list.  Patient reports no complaints.  Contractions: Not present. Vag. Bleeding: None.  Movement: Present. Denies leaking of fluid.   The following portions of the patient's history were reviewed and updated as appropriate: allergies, current medications, past family history, past medical history, past social history, past surgical history and problem list.   Objective:   Vitals:   07/03/20 0826  BP: (!) 114/58  Pulse: 80  Weight: 171 lb (77.6 kg)    Fetal Status: Fetal Heart Rate (bpm): 156 Fundal Height: 37 cm Movement: Present  Presentation: Vertex  General:  Alert, oriented and cooperative. Patient is in no acute distress.  Skin: Skin is warm and dry. No rash noted.   Cardiovascular: Normal heart rate noted  Respiratory: Normal respiratory effort, no problems with respiration noted  Abdomen: Soft, gravid, appropriate for gestational age.  Pain/Pressure: Present     Pelvic: Cervical exam deferred        Extremities: Normal range of motion.  Edema: Trace  Mental Status: Normal mood and affect. Normal behavior. Normal judgment and thought content.   Assessment and Plan:  Pregnancy: G1P0000 at [redacted]w[redacted]d 1. Supervision of other normal pregnancy, antepartum --Anticipatory guidance about next visits/weeks of pregnancy given. --Vertex by [employer] today --Next visit in 2 weeks in the office  - Culture, beta strep (group b only) - Cervicovaginal ancillary only( [place])  Term labor symptoms and general obstetric precautions including but not limited to vaginal bleeding, contractions,  leaking of fluid and fetal movement were reviewed in detail with the patient. Please refer to After Visit Summary for other counseling recommendations.   Return in about 2 weeks (around 07/17/2020).  Future Appointments  Date Time Provider Department Center  07/17/2020  8:15 AM Leftwich-Kirby, Wilmer Floor, CNM [facility]    Sharen Counter, CNM

## 2020-07-03 NOTE — Patient Instructions (Signed)

## 2020-07-04 LAB — CERVICOVAGINAL ANCILLARY ONLY
Chlamydia: NEGATIVE
Comment: NEGATIVE
Comment: NORMAL
Neisseria Gonorrhea: NEGATIVE

## 2020-07-07 LAB — CULTURE, BETA STREP (GROUP B ONLY)
MICRO NUMBER:: 10701619
SPECIMEN QUALITY:: ADEQUATE

## 2020-07-17 ENCOUNTER — Ambulatory Visit (INDEPENDENT_AMBULATORY_CARE_PROVIDER_SITE_OTHER): Payer: 59 | Admitting: Advanced Practice Midwife

## 2020-07-17 ENCOUNTER — Other Ambulatory Visit: Payer: Self-pay

## 2020-07-17 VITALS — BP 119/65 | HR 89 | Wt 173.0 lb

## 2020-07-17 DIAGNOSIS — Z3403 Encounter for supervision of normal first pregnancy, third trimester: Secondary | ICD-10-CM

## 2020-07-17 DIAGNOSIS — Z3A38 38 weeks gestation of pregnancy: Secondary | ICD-10-CM

## 2020-07-17 DIAGNOSIS — Z3480 Encounter for supervision of other normal pregnancy, unspecified trimester: Secondary | ICD-10-CM

## 2020-07-17 NOTE — Patient Instructions (Signed)
Labor Precautions Reasons to come to MAU at [facility]:  1.  Contractions are  5 minutes apart or less, each last 1 minute, these have been going on for 1-2 hours, and you cannot walk or talk during them 2.  You have a large gush of fluid, or a trickle of fluid that will not stop and you have to wear a pad 3.  You have bleeding that is bright red, heavier than spotting--like menstrual bleeding (spotting can be normal in early labor or after a check of your cervix) 4.  You do not feel the baby moving like he/she normally does  

## 2020-07-17 NOTE — Progress Notes (Signed)
   PRENATAL VISIT NOTE  Subjective:  Stephanie Cruz is a 29 y.o. G1P0000 at [redacted]w[redacted]d being seen today for ongoing prenatal care.  She is currently monitored for the following issues for this low-risk pregnancy and has Family history of breast cancer; Genetic testing; Breast cancer screening, high risk patient; Supervision of normal first pregnancy; and Abnormal Pap smear of cervix on their problem list.  Patient reports occasional contractions.  Contractions: Not present. Vag. Bleeding: None.  Movement: Present. Denies leaking of fluid.   The following portions of the patient's history were reviewed and updated as appropriate: allergies, current medications, past family history, past medical history, past social history, past surgical history and problem list.   Objective:   Vitals:   07/17/20 0814  BP: 119/65  Pulse: 89  Weight: 173 lb (78.5 kg)    Fetal Status: Fetal Heart Rate (bpm): 128   Movement: Present     General:  Alert, oriented and cooperative. Patient is in no acute distress.  Skin: Skin is warm and dry. No rash noted.   Cardiovascular: Normal heart rate noted  Respiratory: Normal respiratory effort, no problems with respiration noted  Abdomen: Soft, gravid, appropriate for gestational age.  Pain/Pressure: Present     Pelvic: Cervical exam deferred        Extremities: Normal range of motion.  Edema: Trace  Mental Status: Normal mood and affect. Normal behavior. Normal judgment and thought content.   Assessment and Plan:  Pregnancy: G1P0000 at [redacted]w[redacted]d 1. Supervision of other normal pregnancy, antepartum --Anticipatory guidance about next visits/weeks of pregnancy given. --Leopolds indicates vertex, ROA position, EFW 6.5 lbs --Next visit in 1 week in office, discussed option of SVE and/or sweeping membranes --Pt to continue [employer] and cervical ripening options including evening primrose oil  Term labor symptoms and general obstetric precautions including but  not limited to vaginal bleeding, contractions, leaking of fluid and fetal movement were reviewed in detail with the patient. Please refer to After Visit Summary for other counseling recommendations.   Return in about 1 week (around 07/24/2020).  No future appointments.  Sharen Counter, CNM

## 2020-07-26 ENCOUNTER — Ambulatory Visit (INDEPENDENT_AMBULATORY_CARE_PROVIDER_SITE_OTHER): Payer: 59 | Admitting: Obstetrics & Gynecology

## 2020-07-26 ENCOUNTER — Other Ambulatory Visit: Payer: Self-pay

## 2020-07-26 ENCOUNTER — Encounter: Payer: Self-pay | Admitting: *Deleted

## 2020-07-26 VITALS — BP 122/70 | HR 88 | Wt 174.0 lb

## 2020-07-26 DIAGNOSIS — Z3482 Encounter for supervision of other normal pregnancy, second trimester: Secondary | ICD-10-CM | POA: Diagnosis not present

## 2020-07-26 DIAGNOSIS — Z3483 Encounter for supervision of other normal pregnancy, third trimester: Secondary | ICD-10-CM | POA: Diagnosis not present

## 2020-07-26 DIAGNOSIS — Z3A40 40 weeks gestation of pregnancy: Secondary | ICD-10-CM | POA: Diagnosis not present

## 2020-07-26 DIAGNOSIS — Z3403 Encounter for supervision of normal first pregnancy, third trimester: Secondary | ICD-10-CM | POA: Diagnosis not present

## 2020-07-26 NOTE — Progress Notes (Signed)
   PRENATAL VISIT NOTE  Subjective:  Stephanie Cruz is a 29 y.o. G1P0000 at [redacted]w[redacted]d being seen today for ongoing prenatal care.  She is currently monitored for the following issues for this low-risk pregnancy and has Family history of breast cancer; Genetic testing; Breast cancer screening, high risk patient; Supervision of normal first pregnancy; and Abnormal Pap smear of cervix on their problem list.  Patient reports occasional contractions.  Contractions: Irritability. Vag. Bleeding: None.  Movement: Present. Denies leaking of fluid.   The following portions of the patient's history were reviewed and updated as appropriate: allergies, current medications, past family history, past medical history, past social history, past surgical history and problem list.   Objective:   Vitals:   07/26/20 1434  BP: 122/70  Pulse: 88  Weight: 174 lb (78.9 kg)    Fetal Status: Fetal Heart Rate (bpm): RNST Fundal Height: 40 cm Movement: Present  Presentation: Vertex  General:  Alert, oriented and cooperative. Patient is in no acute distress.  Skin: Skin is warm and dry. No rash noted.   Cardiovascular: Normal heart rate noted  Respiratory: Normal respiratory effort, no problems with respiration noted  Abdomen: Soft, gravid, appropriate for gestational age.  Pain/Pressure: Present     Pelvic: Cervical exam performed in the presence of a chaperone Dilation: 3 Effacement (%): 80 Station: -2  Extremities: Normal range of motion.  Edema: Trace  Mental Status: Normal mood and affect. Normal behavior. Normal judgment and thought content.   Assessment and Plan:  Pregnancy: G1P0000 at [redacted]w[redacted]d 1. [redacted] weeks gestation of pregnancy 2. Encounter for supervision of normal first pregnancy in third trimester NST performed today was reviewed and was found to be reactive.  AFI was also normal.  IOL scheduled but has favorable cervix, will likely go in prior to this.  Orders entered. Term labor symptoms and general  obstetric precautions including but not limited to vaginal bleeding, contractions, leaking of fluid and fetal movement were reviewed in detail with the patient. Please refer to After Visit Summary for other counseling recommendations.   Return for Postpartum check.  No future appointments.  Jaynie Collins, MD

## 2020-07-27 ENCOUNTER — Encounter (HOSPITAL_COMMUNITY): Payer: Self-pay | Admitting: Obstetrics & Gynecology

## 2020-07-27 ENCOUNTER — Inpatient Hospital Stay (HOSPITAL_COMMUNITY): Payer: 59 | Admitting: Anesthesiology

## 2020-07-27 ENCOUNTER — Inpatient Hospital Stay (HOSPITAL_COMMUNITY)
Admission: AD | Admit: 2020-07-27 | Discharge: 2020-07-29 | DRG: 807 | Disposition: A | Discharge status: Home / Self Care | Payer: 59 | Attending: Obstetrics and Gynecology | Admitting: Obstetrics and Gynecology

## 2020-07-27 DIAGNOSIS — D649 Anemia, unspecified: Secondary | ICD-10-CM | POA: Diagnosis not present

## 2020-07-27 DIAGNOSIS — Z3A40 40 weeks gestation of pregnancy: Secondary | ICD-10-CM

## 2020-07-27 DIAGNOSIS — O9902 Anemia complicating childbirth: Secondary | ICD-10-CM | POA: Diagnosis not present

## 2020-07-27 DIAGNOSIS — O480 Post-term pregnancy: Principal | ICD-10-CM | POA: Diagnosis present

## 2020-07-27 DIAGNOSIS — Z20822 Contact with and (suspected) exposure to covid-19: Secondary | ICD-10-CM | POA: Diagnosis present

## 2020-07-27 LAB — COMPREHENSIVE METABOLIC PANEL
ALT: 15 U/L (ref 0–44)
AST: 20 U/L (ref 15–41)
Albumin: 2.9 g/dL — ABNORMAL LOW (ref 3.5–5.0)
Alkaline Phosphatase: 173 U/L — ABNORMAL HIGH (ref 38–126)
Anion gap: 10 (ref 5–15)
BUN: 8 mg/dL (ref 6–20)
CO2: 21 mmol/L — ABNORMAL LOW (ref 22–32)
Calcium: 9.1 mg/dL (ref 8.9–10.3)
Chloride: 104 mmol/L (ref 98–111)
Creatinine, Ser: 0.67 mg/dL (ref 0.44–1.00)
GFR calc Af Amer: >60 mL/min (ref >60)
GFR calc non Af Amer: >60 mL/min (ref >60)
Glucose, Bld: 93 mg/dL (ref 70–99)
Potassium: 3.7 mmol/L (ref 3.5–5.1)
Sodium: 135 mmol/L (ref 135–145)
Total Bilirubin: 0.7 mg/dL (ref 0.3–1.2)
Total Protein: 6.1 g/dL — ABNORMAL LOW (ref 6.5–8.1)

## 2020-07-27 LAB — ABO/RH: ABO/RH(D): A POS

## 2020-07-27 LAB — RPR: RPR Ser Ql: NONREACTIVE

## 2020-07-27 LAB — CBC
HCT: 35.6 % — ABNORMAL LOW (ref 36.0–46.0)
Hemoglobin: 12.1 g/dL (ref 12.0–15.0)
MCH: 32.4 pg (ref 26.0–34.0)
MCHC: 34 g/dL (ref 30.0–36.0)
MCV: 95.4 fL (ref 80.0–100.0)
Platelets: 264 10*3/uL (ref 150–400)
RBC: 3.73 MIL/uL — ABNORMAL LOW (ref 3.87–5.11)
RDW: 12.5 % (ref 11.5–15.5)
WBC: 19.5 10*3/uL — ABNORMAL HIGH (ref 4.0–10.5)
nRBC: 0 % (ref 0.0–0.2)

## 2020-07-27 LAB — TYPE AND SCREEN
ABO/RH(D): A POS
Antibody Screen: NEGATIVE

## 2020-07-27 LAB — SARS CORONAVIRUS 2 BY RT PCR (HOSPITAL ORDER, PERFORMED IN ~~LOC~~ HOSPITAL LAB): SARS Coronavirus 2: NEGATIVE

## 2020-07-27 MED ORDER — LIDOCAINE HCL (PF) 1 % IJ SOLN
INTRAMUSCULAR | Status: DC | PRN
  Administered 2020-07-27 (×2): 4 mL via EPIDURAL

## 2020-07-27 MED ORDER — LACTATED RINGERS IV SOLN
INTRAVENOUS | Status: DC
  Administered 2020-07-27: 125 mL via INTRAVENOUS

## 2020-07-27 MED ORDER — OXYTOCIN-SODIUM CHLORIDE 30-0.9 UT/500ML-% IV SOLN
2.5000 [IU]/h | INTRAVENOUS | Status: DC
  Administered 2020-07-27: 2.5 [IU]/h via INTRAVENOUS

## 2020-07-27 MED ORDER — EPHEDRINE 5 MG/ML INJ
10.0000 mg | INTRAVENOUS | Status: DC | PRN
  Administered 2020-07-27: 10 mg via INTRAVENOUS

## 2020-07-27 MED ORDER — OXYTOCIN BOLUS FROM INFUSION
333.0000 mL | Freq: Once | INTRAVENOUS | Status: AC
  Administered 2020-07-27: 333 mL via INTRAVENOUS

## 2020-07-27 MED ORDER — LACTATED RINGERS IV SOLN
500.0000 mL | Freq: Once | INTRAVENOUS | Status: AC
  Administered 2020-07-27: 500 mL via INTRAVENOUS

## 2020-07-27 MED ORDER — LACTATED RINGERS IV SOLN
500.0000 mL | INTRAVENOUS | Status: DC | PRN
  Administered 2020-07-27: 500 mL via INTRAVENOUS

## 2020-07-27 MED ORDER — SODIUM CHLORIDE (PF) 0.9 % IJ SOLN
INTRAMUSCULAR | Status: DC | PRN
  Administered 2020-07-27: 12 mL/h via EPIDURAL

## 2020-07-27 MED ORDER — FENTANYL CITRATE (PF) 100 MCG/2ML IJ SOLN: 50.0000 ug | INTRAMUSCULAR | Status: DC | PRN

## 2020-07-27 MED ORDER — EPHEDRINE 5 MG/ML INJ
10.0000 mg | INTRAVENOUS | Status: DC | PRN
  Filled 2020-07-27: qty 10

## 2020-07-27 MED ORDER — HYDROXYZINE HCL 50 MG PO TABS
50.0000 mg | ORAL_TABLET | Freq: Four times a day (QID) | ORAL | Status: DC | PRN
  Filled 2020-07-27: qty 1

## 2020-07-27 MED ORDER — LIDOCAINE HCL (PF) 1 % IJ SOLN
30.0000 mL | INTRAMUSCULAR | Status: AC | PRN
  Administered 2020-07-27: 30 mL via SUBCUTANEOUS
  Filled 2020-07-27: qty 30

## 2020-07-27 MED ORDER — FENTANYL-BUPIVACAINE-NACL 0.5-0.125-0.9 MG/250ML-% EP SOLN: 12.0000 mL/h | EPIDURAL | Status: DC | PRN

## 2020-07-27 MED ORDER — PHENYLEPHRINE 40 MCG/ML (10ML) SYRINGE FOR IV PUSH (FOR BLOOD PRESSURE SUPPORT)
80.0000 ug | PREFILLED_SYRINGE | INTRAVENOUS | Status: AC | PRN
  Administered 2020-07-27 (×3): 80 ug via INTRAVENOUS
  Filled 2020-07-27: qty 10

## 2020-07-27 MED ORDER — SOD CITRATE-CITRIC ACID 500-334 MG/5ML PO SOLN: 30.0000 mL | ORAL | Status: DC | PRN

## 2020-07-27 MED ORDER — TERBUTALINE SULFATE 1 MG/ML IJ SOLN: 0.2500 mg | Freq: Once | INTRAMUSCULAR | Status: DC | PRN

## 2020-07-27 MED ORDER — ACETAMINOPHEN 325 MG PO TABS: 650.0000 mg | ORAL_TABLET | ORAL | Status: DC | PRN

## 2020-07-27 MED ORDER — ZOLPIDEM TARTRATE 5 MG PO TABS: 5.0000 mg | ORAL_TABLET | Freq: Every evening | ORAL | Status: DC | PRN

## 2020-07-27 MED ORDER — OXYCODONE-ACETAMINOPHEN 5-325 MG PO TABS: 1.0000 | ORAL_TABLET | ORAL | Status: DC | PRN

## 2020-07-27 MED ORDER — FLEET ENEMA 7-19 GM/118ML RE ENEM: 1.0000 | ENEMA | Freq: Every day | RECTAL | Status: DC | PRN

## 2020-07-27 MED ORDER — ONDANSETRON HCL 4 MG/2ML IJ SOLN
4.0000 mg | Freq: Four times a day (QID) | INTRAMUSCULAR | Status: DC | PRN
  Administered 2020-07-27: 4 mg via INTRAVENOUS
  Filled 2020-07-27: qty 2

## 2020-07-27 MED ORDER — MISOPROSTOL 25 MCG QUARTER TABLET: 25.0000 ug | ORAL_TABLET | ORAL | Status: DC | PRN

## 2020-07-27 MED ORDER — PHENYLEPHRINE 40 MCG/ML (10ML) SYRINGE FOR IV PUSH (FOR BLOOD PRESSURE SUPPORT): 80.0000 ug | PREFILLED_SYRINGE | INTRAVENOUS | Status: DC | PRN

## 2020-07-27 MED ORDER — FENTANYL-BUPIVACAINE-NACL 0.5-0.125-0.9 MG/250ML-% EP SOLN
EPIDURAL | Status: AC
  Filled 2020-07-27: qty 250

## 2020-07-27 MED ORDER — DIPHENHYDRAMINE HCL 50 MG/ML IJ SOLN
12.5000 mg | INTRAMUSCULAR | Status: DC | PRN
  Administered 2020-07-27 (×2): 12.5 mg via INTRAVENOUS
  Filled 2020-07-27 (×2): qty 1

## 2020-07-27 MED ORDER — OXYTOCIN-SODIUM CHLORIDE 30-0.9 UT/500ML-% IV SOLN
1.0000 m[IU]/min | INTRAVENOUS | Status: DC
  Administered 2020-07-27: 2 m[IU]/min via INTRAVENOUS
  Filled 2020-07-27: qty 500

## 2020-07-27 MED ORDER — OXYCODONE-ACETAMINOPHEN 5-325 MG PO TABS: 2.0000 | ORAL_TABLET | ORAL | Status: DC | PRN

## 2020-07-27 NOTE — MAU Note (Signed)
Ctxs since 1500. Was 3cm yesterday at office and membranes stripped.  Some bloody show. Denies LOF

## 2020-07-27 NOTE — MAU Note (Signed)
Covid swab obtained without difficulty and pt tol well. No symptoms 

## 2020-07-27 NOTE — Progress Notes (Signed)
Labor Progress Note Stephanie Cruz is a 29 y.o. G1P0000 at [redacted]w[redacted]d presented for spontaneous labor   S:Feeling a little more nauseous and more pressure in lower back.      O:  BP 109/65   Pulse 89   Temp 98.7 F (37.1 C) (Oral)   Resp 17   Ht 5' 5.5" (1.664 m)   Wt 78.9 kg   LMP 10/19/2019   BMI 28.51 kg/m  EFM: 125/accels present/single variable decel/ mod variability; forebag AROM   CVE: Dilation: 9 Effacement (%): 100 Station: 0 Presentation: Vertex Exam by:: Dr. Myriam Jacobson     A&P: 29 y.o. G1P0000 [redacted]w[redacted]d who presented in spontaneous labor.   #Labor: AROM 0945 and pitocin started at 1225 due to spacing of ctx. Has made slow change but now is 9cm and baby has remained Cat I, no molding or caput. Recheck in one hour and IUPC if no change.  #Pain: managed with epidural  #FWB: continue to monitor, Cat I  #GBS negative Hx LEEP with loop excision in 2016   Gita Kudo, MD 8:05 PM

## 2020-07-27 NOTE — Progress Notes (Signed)
Patient ID: [name], female   DOB: Sep 10, 1991, 29 y.o.   MRN: [id number] Offie Pickron is a 29 y.o. G1P0000 at [redacted]w[redacted]d.  Subjective: Comfortable with epidural  Objective: BP (!) 102/54   Pulse 76   Temp 98.6 F (37 C) (Oral)   Resp 16   Ht 5' 5.5" (1.664 m)   Wt 78.9 kg   LMP 10/19/2019   BMI 28.51 kg/m    FHT:  FHR: 135 bpm, variability: mod,  accelerations:  15x15,  decelerations:  none UC:   Q 3-6 minutes, mod Dilation: 7 Effacement (%): 80 Station: -2 Presentation: Vertex Exam by:: Olegario Messier Crowley-Haward, SNM  Labs: NA  Assessment / Plan: [redacted]w[redacted]d week IUP Labor: Active Fetal Wellbeing:  Category I Pain Control:  Epidural Anticipated MOD:  SVD Start pitocin  Katrinka Blazing, [place], CNM 07/27/2020 12:11 PM

## 2020-07-27 NOTE — H&P (Addendum)
Marland Kitchen OBSTETRIC ADMISSION HISTORY AND PHYSICAL  Stephanie Cruz is a 29 y.o. female G1P0000 with IUP at [redacted]w[redacted]d by LMP presenting for labor.   She reports +FMs, No LOF, no VB, no blurry vision, headaches or peripheral edema, and RUQ pain.  She plans on breastfeeding. She will use condoms for birth control and then may consider Nexplanon.   She received her prenatal care at [facility]   LEEP procedure with loop excision in 2016.  No other complications or concerns.    Her pregnancy course was normal.  Nausea the first 14 weeks, mild anemia at 3rd tri labs.  Normal glucose screening per 2 hr GTT.    Has labored at home overnight after having her membranes stripped at her PNV yesterday.  Tolerating labor well.  Labor intentions include: epidural, skin to skin, FOB is present and supportive.  Revonda Standard states that she would like to "keep things going" and not let them slow down.  Discussed options of watchful waiting, continued movement and positioning, and AROM.  Suspect posterior position and offered positioning tips. All questions and concerns discussed with patient.    Of note, FOB does get light headed at the site of blood.  Precautions suggested and plan made.   Dating: By LMP --->  Estimated Date of Delivery: 07/25/20  Sono:  19.5 weeks; all structures normal  @ 40w 2d, CWD, normal anatomy, vertex presentation,   Prenatal History/Complications:  Past Medical History: Past Medical History:  Diagnosis Date   Abnormal Pap smear of cervix 11/2016   Hx of LEEP 10/2015 in [place], [place] for CIN 2 from colposcopy.  Final LEEP pathology - LGSIL.   Anxiety    Not formally dx'd   Dysmenorrhea    Family history of breast cancer     Past Surgical History: Past Surgical History:  Procedure Laterality Date   CERVICAL BIOPSY  W/ LOOP ELECTRODE EXCISION  11/2016   [place]    Obstetrical History: OB History     Gravida  1   Para  0   Term  0   Preterm  0   AB  0   Living  0       SAB  0   TAB  0   Ectopic  0   Multiple  0   Live Births  0           Social History: Social History   Socioeconomic History   Marital status: Married    Spouse name: Not on file   Number of children: Not on file   Years of education: Not on file   Highest education level: Not on file  Occupational History   Occupation: PT  Tobacco Use   Smoking status: Never Smoker   Smokeless tobacco: Never Used  Vaping Use   Vaping Use: Never used  Substance and Sexual Activity   Alcohol use: Yes    Alcohol/week: 1.0 standard drink    Types: 1 Glasses of wine per week   Drug use: Never   Sexual activity: Yes    Birth control/protection: None    Comment: condoms everytime  Other Topics Concern   Not on file  Social History Narrative   Not on file   Social Determinants of Health   Financial Resource Strain:    Difficulty of Paying Living Expenses:   Food Insecurity:    Worried About Radiation protection practitioner of Food in the Last Year:    Ran Out of Food in the  Last Year:   Transportation Needs:    Film/video editor (Medical):    Lack of Transportation (Non-Medical):   Physical Activity:    Days of Exercise per Week:    Minutes of Exercise per Session:   Stress:    Feeling of Stress :   Social Connections:    Frequency of Communication with Friends and Family:    Frequency of Social Gatherings with Friends and Family:    Attends Religious Services:    Active Member of Clubs or Organizations:    Attends Archivist Meetings:    Marital Status:     Family History: Family History  Problem Relation Age of Onset   Breast cancer Mother 37       Dec with Met.Breast Ca   Migraines Mother    Migraines Sister    Migraines Brother    Stroke Maternal Grandfather    Diabetes Paternal Grandmother    Diabetes Paternal Grandfather    Leukemia Cousin 10       pat first cousin    Allergies: No Known Allergies  Medications Prior to Admission  Medication Sig  Dispense Refill Last Dose   cetirizine (ZYRTEC) 10 MG tablet Take 1 tablet by mouth as needed.   07/26/2020 at Unknown time   Docosahexaenoic Acid (PRENATAL DHA) 200 MG CAPS    07/26/2020 at Unknown time   EQL Evening Primrose Oil 500 MG CAPS    07/26/2020 at Unknown time   Biotin 1000 MCG CHEW Chew 1 tablet by mouth daily.   More than a month at Unknown time   cholecalciferol (VITAMIN D3) 25 MCG (1000 UT) tablet Take 5,000 Units by mouth daily.   More than a month at Unknown time   Iron-Vitamin C (IRON 100/C) 100-250 MG TABS    More than a month at Unknown time     Review of Systems   All systems reviewed and negative except as stated in HPI  Blood pressure 124/68, pulse 75, temperature 98.4 F (36.9 C), resp. rate 16, height 5' 5.5" (1.664 m), weight 78.9 kg, last menstrual period 10/19/2019. General appearance: alert, cooperative, appears stated age and mild distress Lungs: clear to auscultation bilaterally Heart: regular rate and rhythm Abdomen: soft, non-tender; bowel sounds normal Extremities: Homans sign is negative, no sign of DVT Presentation: cephalic Fetal monitoringBaseline: 140 bpm, Variability: Good {> 6 bpm) and Accelerations: Reactive Uterine activityFrequency: Every 3-4 minutes, Duration: 60-90 seconds and Intensity: mod Dilation: 5 Effacement (%): 80 Station: -1 Exam by:: DCALLAWAY, RN   Prenatal labs: ABO, Rh: --/--/PENDING (08/06 0543) Antibody: PENDING (08/06 0543) Rubella: 4.19 (01/06 0901) RPR: NON-REACTIVE (05/12 0850)  HBsAg: NON-REACTIVE (01/06 0901)  HIV: NON-REACTIVE (05/12 0850)  GBS:   neg 2 hr GTT: 84, 150, 118 Genetic screening: cell free DNA neg; fetal sex: female Anatomy US: at 19.5 weeks - normal  H & H on 5.13.21: 10.8/32.6  Prenatal Transfer Tool  Maternal Diabetes: No Genetic Screening: Normal Maternal Ultrasounds/Referrals: Normal Fetal Ultrasounds or other Referrals:  None Maternal Substance Abuse:  No Significant Maternal Medications:   None Significant Maternal Lab Results: None  Results for orders placed or performed during the hospital encounter of 07/27/20 (from the past 24 hour(s))  Type and screen   Collection Time: 07/27/20  5:43 AM  Result Value Ref Range   ABO/RH(D) PENDING    Antibody Screen PENDING    Sample Expiration      07/30/2020,2359 Performed at [facility], [address]  [address], [place], [place] [address]     Patient Active Problem List   Diagnosis Date Noted   Post term pregnancy over 40 weeks 07/27/2020   Abnormal Pap smear of cervix 05/02/2020   Supervision of normal first pregnancy 12/28/2019   Breast cancer screening, high risk patient 08/17/2019   Genetic testing 07/05/2019   Family history of breast cancer     Assessment/Plan:  Stephanie Cruz is a 54 y.o. G1P0000 at 62w2dhere for spontaneous onset of labor.  Pt will eat, get settled, and then would like to consider AROM.    FOB at the bedside and supportive.   Anticipate vaginal delivery.    #Labor: progressing normally #Pain: Currently rates as 5; desires epidural  #FWB: Continued fetal monitoring  #MOF: breast #MOC:condoms and then Nexplanon #Circ: need to check back with family    KTonna Corner Student-MidWife  07/27/2020, 6:20 AM   I personally saw and evaluated the patient, performing the key elements of the service. I developed and verified the management plan that is described in the resident's/student's note, and I agree with the content with my edits above. VSS, HRR&R, Resp unlabored, Legs neg.  FNigel Berthold CNM 07/27/2020 8:33 AM

## 2020-07-27 NOTE — Discharge Summary (Signed)
Postpartum Discharge Summary     Patient Name: Stephanie Cruz DOB: May 26, 1991 MRN: [id number]  Date of admission: 07/27/2020 Delivery date:07/27/2020  Delivering provider: Laury Deep  Date of discharge: 07/29/2020  Admitting diagnosis: Post term pregnancy over 40 weeks [O48.0] Intrauterine pregnancy: [redacted]w[redacted]d    Secondary diagnosis:  Active Problems:   Post term pregnancy over 40 weeks  Additional problems: none    Discharge diagnosis: Term Pregnancy Delivered                                              Post partum procedures:none Augmentation: AROM and Pitocin Complications: None  Hospital course: Onset of Labor With Vaginal Delivery      29y.o. yo G1P0000 at 471w2das admitted in Latent Labor on 07/27/2020. Patient had an uncomplicated labor course as follows:  Membrane Rupture Time/Date: 9:38 AM ,07/27/2020   Delivery Method:Vaginal, Spontaneous  Episiotomy: None  Lacerations:  1st degree;Vaginal  Patient had an uncomplicated postpartum course.  She is ambulating, tolerating a regular diet, passing flatus, and urinating well. Patient is discharged home in stable condition on 07/29/20.  Newborn Data: Birth date:07/27/2020  Birth time:10:12 PM  Gender:Female  Living status:Living  Apgars:8 ,9  Weight:3926 g   Magnesium Sulfate received: No BMZ received: No Rhophylac:N/A MMR:No T-DaP:Given prenatally Flu: Yes Transfusion:No  Physical exam  Vitals:   07/28/20 1000 07/28/20 1400 07/28/20 2204 07/29/20 0651  BP: 106/60 (!) 97/53 90/60 (!) 107/52  Pulse: 76 67 66 60  Resp: '18 17 17 18  '$ Temp: 97.8 F (36.6 C) 97.6 F (36.4 C) 98.1 F (36.7 C) 97.8 F (36.6 C)  TempSrc: Oral Oral Oral   SpO2: 98% 100% 100%   Weight:      Height:       General: alert, cooperative and no distress Lochia: appropriate Uterine Fundus: firm Incision: N/A DVT Evaluation: No evidence of DVT seen on physical exam. Labs: Lab Results  Component Value Date   WBC 19.5 (H) 07/27/2020    HGB 12.1 07/27/2020   HCT 35.6 (L) 07/27/2020   MCV 95.4 07/27/2020   PLT 264 07/27/2020   CMP Latest Ref Rng & Units 07/27/2020  Glucose 70 - 99 mg/dL 93  BUN 6 - 20 mg/dL 8  Creatinine 0.44 - 1.00 mg/dL 0.67  Sodium 135 - 145 mmol/L 135  Potassium 3.5 - 5.1 mmol/L 3.7  Chloride 98 - 111 mmol/L 104  CO2 22 - 32 mmol/L 21(L)  Calcium 8.9 - 10.3 mg/dL 9.1  Total Protein 6.5 - 8.1 g/dL 6.1(L)  Total Bilirubin 0.3 - 1.2 mg/dL 0.7  Alkaline Phos 38 - 126 U/L 173(H)  AST 15 - 41 U/L 20  ALT 0 - 44 U/L 15   Edinburgh Score: Edinburgh Postnatal Depression Scale Screening Tool 07/28/2020  I have been able to laugh and see the funny side of things. 0  I have looked forward with enjoyment to things. 0  I have blamed myself unnecessarily when things went wrong. 1  I have been anxious or worried for no good reason. 2  I have felt scared or panicky for no good reason. 0  Things have been getting on top of me. 1  I have been so unhappy that I have had difficulty sleeping. 0  I have felt sad or miserable. 0  I have been so unhappy that I have  been crying. 0  The thought of harming myself has occurred to me. 0  Edinburgh Postnatal Depression Scale Total 4     After visit meds:  Allergies as of 07/29/2020   No Known Allergies     Medication List    TAKE these medications   acetaminophen 325 MG tablet Commonly known as: Tylenol Take 2 tablets (650 mg total) by mouth every 6 (six) hours.   cetirizine 10 MG tablet Commonly known as: ZYRTEC Take 1 tablet by mouth as needed.   EQL Evening Primrose Oil 500 MG Caps   ferrous sulfate 325 (65 FE) MG EC tablet Take 1 tablet (325 mg total) by mouth daily with breakfast. OK to take every other day if it causes constipation.   ibuprofen 600 MG tablet Commonly known as: ADVIL Take 1 tablet (600 mg total) by mouth every 6 (six) hours.   PreNatal DHA 200 MG Caps Generic drug: Docosahexaenoic Acid   Raspberry Ketones 100 MG Caps Take 100  mg by mouth daily.       Discharge home in stable condition Infant Feeding: Breast Infant Disposition:home with mother Discharge instruction: per After Visit Summary and Postpartum booklet. Activity: Advance as tolerated. Pelvic rest for 6 weeks.  Diet: routine diet Future Appointments: No future appointments. Follow up Visit:  [place] [facility] [place] [facility] [place]. Schedule an appointment as soon as possible for a visit in 4 week(s).   Specialty: Obstetrics and Gynecology Contact information: [place], [place] [phone]               Please schedule this patient for a In person postpartum visit in 4 weeks with the following provider: Any provider. Additional Postpartum F/U:none  Low risk pregnancy complicated by: none Delivery mode:  Vaginal, Spontaneous  Anticipated Birth Control:  Nexplanon - in office   07/29/2020 Randa Ngo, MD  OB Fellow, Faculty Practice

## 2020-07-27 NOTE — Anesthesia Procedure Notes (Signed)
Epidural Patient location during procedure: OB Start time: 07/27/2020 8:02 AM End time: 07/27/2020 8:10 AM  Staffing Anesthesiologist: Mal Amabile, MD Performed: anesthesiologist   Preanesthetic Checklist Completed: patient identified, IV checked, site marked, risks and benefits discussed, surgical consent, monitors and equipment checked, pre-op evaluation and timeout performed  Epidural Patient position: sitting Prep: DuraPrep and site prepped and draped Patient monitoring: continuous pulse ox and blood pressure Approach: midline Location: L3-L4 Injection technique: LOR saline  Needle:  Needle type: Tuohy  Needle gauge: 17 G Needle length: 9 cm and 9 Needle insertion depth: 4 cm Catheter type: closed end flexible Catheter size: 19 Gauge Catheter at skin depth: 9 cm Test dose: negative and Other  Assessment Events: blood not aspirated, injection not painful, no injection resistance, no paresthesia and negative IV test  Additional Notes Patient identified. Risks and benefits discussed including failed block, incomplete  Pain control, post dural puncture headache, nerve damage, paralysis, blood pressure Changes, nausea, vomiting, reactions to medications-both toxic and allergic and post Partum back pain. All questions were answered. Patient expressed understanding and wished to proceed. Sterile technique was used throughout procedure. Epidural site was Dressed with sterile barrier dressing. No paresthesias, signs of intravascular injection Or signs of intrathecal spread were encountered.  Patient was more comfortable after the epidural was dosed. Please see RN's note for documentation of vital signs and FHR which are stable. Reason for block:procedure for pain

## 2020-07-27 NOTE — Progress Notes (Addendum)
Labor Progress Note Stephanie Cruz is a 29 y.o. G1P0000 at [redacted]w[redacted]d presented for spontaneous labor   S: Stephanie Cruz is beginning to feel her contractions now.  Pitocin is running at 10 mu now. RN noted a forebag on cervical exam and Stephanie Cruz wishes it to be ruptured. Was having some nausea as well as hypotension which has now resolved.   FOB present at bedside and supportive   O:  BP 108/61   Pulse (!) 103   Temp 98.6 F (37 C) (Oral)   Resp 16   Ht 5' 5.5" (1.664 m)   Wt 78.9 kg   LMP 10/19/2019   BMI 28.51 kg/m  EFM: 140/accels present/decels absent/ good variability   CVE: Dilation: 9 Effacement (%): 100 Station: 0 Presentation: Vertex Exam by:: Albertine Grates RN   AROM in forebag    A&P: 29 y.o. G1P0000 [redacted]w[redacted]d presents in early labor  #Labor: Progressing well. Anticipate SVD.  #Pain: managed with epidural  #FWB: continue to monitor, Cat I  #GBS negative Hx LEEP with loop excision in 2016   [facility], Student-MidWife 6:18 PM   I saw and evaluated the patient. I agree with the findings and the plan of care as documented in the midwifery student's note.  Casper Harrison, MD [facility] Family Medicine Fellow, [facility] for [facility], [facility]

## 2020-07-27 NOTE — Anesthesia Preprocedure Evaluation (Signed)
Anesthesia Evaluation  Patient identified by MRN, date of birth, ID band Patient awake    Reviewed: Allergy & Precautions, Patient's Chart, lab work & pertinent test results  Airway Mallampati: II  TM Distance: >3 FB Neck ROM: Full    Dental no notable dental hx. (+) Teeth Intact   Pulmonary neg pulmonary ROS,    Pulmonary exam normal breath sounds clear to auscultation       Cardiovascular negative cardio ROS Normal cardiovascular exam Rhythm:Regular Rate:Normal     Neuro/Psych negative neurological ROS     GI/Hepatic Neg liver ROS, GERD  ,  Endo/Other  negative endocrine ROS  Renal/GU negative Renal ROS  negative genitourinary   Musculoskeletal negative musculoskeletal ROS (+)   Abdominal   Peds  Hematology  (+) anemia ,   Anesthesia Other Findings   Reproductive/Obstetrics (+) Pregnancy                             Anesthesia Physical Anesthesia Plan  ASA: II  Anesthesia Plan: Epidural   Post-op Pain Management:    Induction:   PONV Risk Score and Plan:   Airway Management Planned: Natural Airway  Additional Equipment:   Intra-op Plan:   Post-operative Plan:   Informed Consent: I have reviewed the patients History and Physical, chart, labs and discussed the procedure including the risks, benefits and alternatives for the proposed anesthesia with the patient or authorized representative who has indicated his/her understanding and acceptance.       Plan Discussed with: Anesthesiologist  Anesthesia Plan Comments:         Anesthesia Quick Evaluation

## 2020-07-28 ENCOUNTER — Encounter (HOSPITAL_COMMUNITY): Payer: Self-pay | Admitting: Obstetrics & Gynecology

## 2020-07-28 MED ORDER — SENNOSIDES-DOCUSATE SODIUM 8.6-50 MG PO TABS
2.0000 | ORAL_TABLET | ORAL | Status: DC
  Administered 2020-07-28: 2 via ORAL
  Filled 2020-07-28: qty 2

## 2020-07-28 MED ORDER — ZOLPIDEM TARTRATE 5 MG PO TABS: 5.0000 mg | ORAL_TABLET | Freq: Every evening | ORAL | Status: DC | PRN

## 2020-07-28 MED ORDER — DIBUCAINE (PERIANAL) 1 % EX OINT: 1.0000 "application " | TOPICAL_OINTMENT | CUTANEOUS | Status: DC | PRN

## 2020-07-28 MED ORDER — BENZOCAINE-MENTHOL 20-0.5 % EX AERO
1.0000 "application " | INHALATION_SPRAY | CUTANEOUS | Status: DC | PRN
  Administered 2020-07-28: 1 via TOPICAL
  Filled 2020-07-28: qty 56

## 2020-07-28 MED ORDER — COCONUT OIL OIL: 1.0000 "application " | TOPICAL_OIL | Status: DC | PRN

## 2020-07-28 MED ORDER — TETANUS-DIPHTH-ACELL PERTUSSIS 5-2.5-18.5 LF-MCG/0.5 IM SUSP: 0.5000 mL | Freq: Once | INTRAMUSCULAR | Status: DC

## 2020-07-28 MED ORDER — ACETAMINOPHEN 325 MG PO TABS
650.0000 mg | ORAL_TABLET | ORAL | Status: DC | PRN
  Administered 2020-07-28: 650 mg via ORAL
  Filled 2020-07-28: qty 2

## 2020-07-28 MED ORDER — SIMETHICONE 80 MG PO CHEW: 80.0000 mg | CHEWABLE_TABLET | ORAL | Status: DC | PRN

## 2020-07-28 MED ORDER — ONDANSETRON HCL 4 MG/2ML IJ SOLN: 4.0000 mg | INTRAMUSCULAR | Status: DC | PRN

## 2020-07-28 MED ORDER — IBUPROFEN 600 MG PO TABS
600.0000 mg | ORAL_TABLET | Freq: Four times a day (QID) | ORAL | Status: DC
  Administered 2020-07-28 – 2020-07-29 (×8): 600 mg via ORAL
  Filled 2020-07-28 (×7): qty 1

## 2020-07-28 MED ORDER — ONDANSETRON HCL 4 MG PO TABS: 4.0000 mg | ORAL_TABLET | ORAL | Status: DC | PRN

## 2020-07-28 MED ORDER — DIPHENHYDRAMINE HCL 25 MG PO CAPS: 25.0000 mg | ORAL_CAPSULE | Freq: Four times a day (QID) | ORAL | Status: DC | PRN

## 2020-07-28 MED ORDER — PRENATAL MULTIVITAMIN CH
1.0000 | ORAL_TABLET | Freq: Every day | ORAL | Status: DC
  Administered 2020-07-28 – 2020-07-29 (×2): 1 via ORAL
  Filled 2020-07-28 (×2): qty 1

## 2020-07-28 MED ORDER — WITCH HAZEL-GLYCERIN EX PADS: 1.0000 "application " | MEDICATED_PAD | CUTANEOUS | Status: DC | PRN

## 2020-07-28 NOTE — Lactation Note (Addendum)
This note was copied from a Stephanie's chart. Lactation Consultation Note  Patient Name: Stephanie Cruz VFIEP'P Date: 07/28/2020  Stephanie Cruz now 53 hours old.  Mom requested to see lactation reports he has been very fussy. RN came in to assess Stephanie.  LC and mom worked with some massage,hand expression.  Mom able to remove small drops of colostrum with hand expression.  Mom reports he comes off a lot during feeding.  Mom has a DEBP at home but does not have a manual.  Mom reports she would like a manual pump.   After RN assessed Stephanie.  Gave him to mom.  Mom trying to latch him in cradle hold.  He is up to high to latch well and keeps coming off and on.  Showed mom how to bring him chin first.   Minimal assist with latch once got him in position to do chin first. Infant came off and on at first and they did rythmic sucking with intermittent swallows. Infant breastfed 30 minutes on left breast and still feeding when LC left room.  Mom reports comfort. Demo with mom how to use the manual pump on breast infant not feeding on.  Colostrum easily expressed with hmanaul pump.  Discussed adding some hand expression and spoon feeding.  Urged parents to watch [employer] expression Teacher, English as a foreign language.Mom report she took a [facility] class and has [facility] esource list for d/c.    Maternal Data    Feeding Feeding Type: Breast Fed  [facility] Score                   Interventions    Lactation Tools Discussed/Used     Consult Status      Stephanie Cruz 07/28/2020, 10:41 PM

## 2020-07-28 NOTE — Lactation Note (Signed)
This note was copied from a baby's chart. Lactation Consultation Note  Patient Name: Stephanie Cruz Today's Date: 07/28/2020 Reason for consult: Initial assessment   P1, Baby sleeping after bath.  Recently breastfed.  12 hours old. Mother states she knows how to hand express. Reviewed basics.  Suggest mother call when baby wakes to assist w/ latch. Feed on demand with cues.  Goal 8-12+ times per day after first 24 hrs.  Place baby STS if not cueing.  Mom made aware of O/P services, breastfeeding support groups, community resources, and our phone # for post-discharge questions.     Maternal Data Has patient been taught Hand Expression?: Yes Does the patient have breastfeeding experience prior to this delivery?: No  Feeding Feeding Type: Breast Fed  LATCH Score Latch: Grasps breast easily, tongue down, lips flanged, rhythmical sucking.  Audible Swallowing: None  Type of Nipple: Everted at rest and after stimulation  Comfort (Breast/Nipple): Soft / non-tender  Hold (Positioning): No assistance needed to correctly position infant at breast.  LATCH Score: 8  Interventions Interventions: Breast feeding basics reviewed  Lactation Tools Discussed/Used     Consult Status Consult Status: Follow-up Date: 07/28/20 Follow-up type: In-patient    Dahlia Byes [facility] 07/28/2020, 11:45 AM

## 2020-07-28 NOTE — Anesthesia Postprocedure Evaluation (Signed)
Anesthesia Post Note  Patient: Stephanie Cruz  Procedure(s) Performed: AN AD HOC LABOR EPIDURAL     Patient location during evaluation: Mother Baby Anesthesia Type: Epidural Level of consciousness: awake and alert Pain management: pain level controlled Vital Signs Assessment: post-procedure vital signs reviewed and stable Respiratory status: spontaneous breathing, nonlabored ventilation and respiratory function stable Cardiovascular status: stable Postop Assessment: no headache, no backache and epidural receding Anesthetic complications: no Comments: Per telephone conversation   No complications documented.  Last Vitals:  Vitals:   07/28/20 0223 07/28/20 0655  BP: 100/60 103/61  Pulse: 66 63  Resp: 18 18  Temp:  37.2 C  SpO2: 98%     Last Pain:  Vitals:   07/28/20 0655  TempSrc: Oral  PainSc: 0-No pain   Pain Goal: Patients Stated Pain Goal: 0 (07/27/20 0508)                 Roselle Locus

## 2020-07-28 NOTE — Progress Notes (Addendum)
Patient ID: [name], female   DOB: Aug 15, 1991, 29 y.o.   MRN: [id number]  Post Partum Day 1 s/p SVD at [redacted]w[redacted]d.  Subjective:  Jaqlyn Gruenhagen is a pleasant 29 yo female PPD 1 s/p SVD who was awake sitting upright in bed this morning. She reports no complaints. Does not endorse any pain. Vaginal bleeding has decreased since delivery. Has been ambulating in the room out of bed, tolerating PO, and having good UOP. Some minor discomfort urinating, 2/2 1st degree vaginal laceration repaired at delivery. Would like to have baby boy circumcised.   Objective: Blood pressure 103/61, pulse 63, temperature 98.9 F (37.2 C), temperature source Oral, resp. rate 18, height 5' 5.5" (1.664 m), weight 78.9 kg, last menstrual period 10/19/2019, SpO2 98 %, unknown if currently breastfeeding.  Physical Exam:  General: alert and no distress Lochia: appropriate and decreasing Uterine Fundus: firm Incision: none DVT Evaluation: No evidence of DVT seen on physical exam.  Recent Labs    07/27/20 0543  HGB 12.1  HCT 35.6*    Assessment/Plan:  Nekeshia Lenhardt is a 29 y.o. G80P1001 female s/p SVD at [redacted]w[redacted]d.  # Routine Postpartum Care Patient is doing well. Will continue postpartum care.  Contraception: condoms until outpatient follow-up. Discussed with patient the benefits of postpartum contraception. Feeding: Breast. Lactation consult to improve feeds. Disposition: Plan for discharge tomorrow.     LOS: 1 day   Jake T Francisco 07/28/2020, 9:07 AM     Provider attestation I have seen and examined this patient and agree with above documentation in the medical student's note.   Post Partum Day 1  Pascha Fogal is a 29 y.o. G1P1001 s/p SVD.  Pt denies problems with ambulating, voiding or po intake. Pain is well controlled. Method of Feeding: breast  Plans inpatient circumcision for baby boy.   PE:  Gen: well appearing Heart: reg rate Lungs: normal WOB Fundus firm Ext:  soft, no pain, no edema  Assessment: S/p SVD PPD #1  Plan for discharge: tomorrow  Judeth Horn, NP 10:05 AM

## 2020-07-29 ENCOUNTER — Inpatient Hospital Stay (HOSPITAL_COMMUNITY): Admission: AD | Admit: 2020-07-29 | Payer: 59 | Source: Home / Self Care | Admitting: Obstetrics & Gynecology

## 2020-07-29 ENCOUNTER — Inpatient Hospital Stay (HOSPITAL_COMMUNITY): Payer: 59

## 2020-07-29 MED ORDER — FERROUS SULFATE 325 (65 FE) MG PO TBEC
325.0000 mg | DELAYED_RELEASE_TABLET | Freq: Every day | ORAL | 3 refills | Status: DC
Start: 2020-07-29 — End: 2021-04-01

## 2020-07-29 MED ORDER — IBUPROFEN 600 MG PO TABS: 600.0000 mg | ORAL_TABLET | Freq: Four times a day (QID) | ORAL | 0 refills | Status: DC

## 2020-07-29 MED ORDER — ACETAMINOPHEN 325 MG PO TABS: 650.0000 mg | ORAL_TABLET | Freq: Four times a day (QID) | ORAL | Status: AC

## 2020-07-29 NOTE — Lactation Note (Signed)
This note was copied from a baby's chart. Lactation Consultation Note  Patient Name: Stephanie Cruz [id number] Date: 07/29/2020 Reason for consult: Follow-up assessment;Primapara;1st time breastfeeding;Term;Infant weight loss Baby is 48 hours old / bili - 7.8 at 31 hours  As LC entered the room with Dr. Manson Passey , baby was examined 1st and then  [facility] assisted with positioning for a cross cradle and mom sitting up in the chair.  LC assisted with depth and baby was fussy at 1st. And once the nipple/ areola was molded in the mouth the baby was able to get into the consistent pattern with increased swallows with compressions.  Sore nipple and engorgement prevention and tx reviewed.  Mom has been given a hand pump and #24 F and #27 F provided.  Per mom has a DEBP at home.  LC stressed the importance of STS Feedings until the baby is back to birth, gaining steadily and can stay awake for majority of the feeding.  LC discussed nutritive vs non - nutritive feeding patterns and the importance of  Watching the baby for hanging out latched.  Mom has the [facility] pamphlet with phone numbers.   Mom and dad expressed appreciation for assistance and LC information.  LC praised mom for her efforts breast feeding.   Maternal Data Has patient been taught Hand Expression?: Yes  Feeding Feeding Type: Breast Fed  LATCH Score Latch: Grasps breast easily, tongue down, lips flanged, rhythmical sucking.  Audible Swallowing: Spontaneous and intermittent  Type of Nipple: Everted at rest and after stimulation  Comfort (Breast/Nipple): Soft / non-tender  Hold (Positioning): Assistance needed to correctly position infant at breast and maintain latch.  LATCH Score: 9  Interventions Interventions: Breast feeding basics reviewed;Assisted with latch;Skin to skin;Breast massage;Hand express;Breast compression;Adjust position;Support pillows  Lactation Tools Discussed/Used Tools: Pump Breast pump type: Manual WIC  Program: No Pump Review: Milk Storage   Consult Status Consult Status: Complete Date: 07/29/20    Kathrin Greathouse 07/29/2020, 10:39 AM

## 2020-07-29 NOTE — Progress Notes (Signed)
MOB was referred for history of anxiety. * Referral screened out by Clinical Social Worker because none of the following criteria appear to apply: ~ History of anxiety/depression during this pregnancy, or of post-partum depression following prior delivery. ~ Diagnosis of anxiety and/or depression within last 3 years OR * MOB's symptoms currently being treated with medication and/or therapy. Please contact the Clinical Social Worker if needs arise, by [facility] request, or if MOB scores greater than 9/yes to question 10 on Edinburgh Postpartum Depression Screen.  Annaliese Saez D. Dortha Kern, MSW, LCSW Clinical Social Worker [phone]

## 2020-07-30 ENCOUNTER — Ambulatory Visit: Payer: Self-pay

## 2020-07-30 ENCOUNTER — Telehealth: Payer: Self-pay | Admitting: *Deleted

## 2020-07-30 ENCOUNTER — Encounter: Payer: Self-pay | Admitting: *Deleted

## 2020-07-30 NOTE — Telephone Encounter (Signed)
Left patient a message to call and schedule 4 week Postpartum and Nexplanon Insert.

## 2020-07-30 NOTE — Lactation Note (Signed)
This note was copied from a baby's chart. Lactation Consultation Note  Patient Name: Boy Dellie Piasecki [id number] Date: 07/30/2020 Reason for consult: Follow-up assessment;Primapara;1st time breastfeeding;Term;Infant weight loss;Other (Comment) (8 % weight loss , repeat Bilirubin) Baby is 60 hours old , last Bili - 13.4 at 55 hours.  As LC entered the room baby resting in bed sleeping, per mom baby last fed at 8:06 for 55 mins ( both breast) and increased swallows.  Per mom nipples are tender, LC offered to assess and noted both to be pink , on the right nipple  lower portion , light bruising.  LC recommended EBM liberally and comfort gels x 6 days  after baby feeds and Alternating with shells while awake. Mom has a hand pump ( aware of how to operate ) and a DEBP at home.  Due to the 8 % weight loss, LC recommended prior to latching - breast massage, hand express, STS feedings with feeding cues and by 3 hours ( 8-12 in 24 hours) , post pump both breast after 4 feedings a day for 10 - 15 mis and save for feeding the EBM back to the baby after the next feeding for extra calories or appetizer.  Sore nipple and engorgement prevention and tx reviewed.  Mom has the [facility] pamphlet with phone numbers and aware of the [facility] baby.    Maternal Data    Feeding Feeding Type:  (baby recently fed)  LATCH Score Latch: Grasps breast easily, tongue down, lips flanged, rhythmical sucking.  Audible Swallowing: Spontaneous and intermittent  Type of Nipple: Everted at rest and after stimulation  Comfort (Breast/Nipple): Filling, red/small blisters or bruises, mild/mod discomfort  Hold (Positioning): Assistance needed to correctly position infant at breast and maintain latch.  LATCH Score: 8  Interventions Interventions: Breast feeding basics reviewed  Lactation Tools Discussed/Used Tools: Pump Flange Size: 24;27 Breast pump type: Manual Pump Review: Milk Storage   Consult Status Consult  Status: Complete Date: 07/30/20    Kathrin Greathouse 07/30/2020, 10:17 AM

## 2020-08-13 ENCOUNTER — Telehealth: Payer: Self-pay | Admitting: *Deleted

## 2020-08-13 NOTE — Telephone Encounter (Signed)
Spoke with patient about her my chart message.  She thought she either had a blocked duct or mastitis.  She awas not running any fever .  She states that she was able to massage the area and get it unclogged.  She is not having any further problems at the present.  PP appt scheduled for 08/24/20

## 2020-08-24 ENCOUNTER — Encounter: Payer: Self-pay | Admitting: Family Medicine

## 2020-08-24 ENCOUNTER — Telehealth (INDEPENDENT_AMBULATORY_CARE_PROVIDER_SITE_OTHER): Payer: 59 | Admitting: Family Medicine

## 2020-08-24 NOTE — Progress Notes (Signed)
I connected with Stephanie Cruz  on 08/24/20 at  9:45 AM EDT by: Mychart video and verified that I am speaking with the correct person using two identifiers.  Patient is located at home and provider is located at [employer] for [employer] at [place] .     The purpose of this virtual visit is to provide medical care while limiting exposure to the novel coronavirus. I discussed the limitations, risks, security and privacy concerns of performing an evaluation and management service by [facility] and the availability of in person appointments. I also discussed with the patient that there may be a patient responsible charge related to this service. By engaging in this virtual visit, you consent to the provision of healthcare.  Additionally, you authorize for your insurance to be billed for the services provided during this visit.  The patient expressed understanding and agreed to proceed.  The following staff members participated in the virtual visit:  Jani Gravel Audrena Talaga,MD  Post Partum Visit Note Subjective:   Stephanie Cruz is a 29 y.o. G79P1001 female being evaluated for postpartum followup.  She is 4 weeks postpartum following a normal spontaneous vaginal delivery at  [redacted]w[redacted]d gestational weeks.  I have fully reviewed the prenatal and intrapartum course; pregnancy complicated by none.  Postpartum course has been unremarkable. Baby is doing well. Baby is feeding by breast. Bleeding no bleeding. Bowel function is normal. Bladder function is normal. Patient is not sexually active. Contraception method is condoms. Postpartum depression screening: negative. I independently verified the above information.  The pregnancy intention screening data noted above was reviewed. Potential methods of contraception were discussed. The patient elected to proceed with Female Condom.   The following portions of the patient's history were reviewed and updated as appropriate: allergies,  current medications, past family history, past medical history, past social history, past surgical history and problem list.  Review of Systems Pertinent items noted in HPI and remainder of comprehensive ROS otherwise negative.   Objective:   Vitals:   08/24/20 1001  BP: 105/60  Pulse: 78  Weight: 152 lb (68.9 kg)   Self-Obtained Gen - patient is awake, alert in no acute distress Remainder of exam deferred due to nature of visit.       Assessment:   Normal postpartum exam.  Plan:  Essential components of care per ACOG recommendations:  1.  Mood and well being: Patient with negative depression screening today. Reviewed local resources for support.   2. Infant care and feeding:  -Patient currently breastmilk feeding? Yes Discussed return to work and pumping. If needed, patient was provided letter for work to allow for every 2-3 hr pumping breaks, and to be granted a private location to express breastmilk and refrigerated area to store breastmilk. Reviewed importance of draining breast regularly to support lactation.  3. Sexuality, contraception and birth spacing - Patient does not want a pregnancy in the next year.  - Reviewed forms of contraception in tiered fashion. Patient desired condoms today.   - Discussed birth spacing of 18 months  4. Sleep and fatigue -Encouraged family/partner/community support of 4 hrs of uninterrupted sleep to help with mood and fatigue  5. Physical Recovery  - Discussed patients delivery and complications - Patient had a 1st degree laceration, perineal healing reviewed. Patient expressed understanding - Patient has urinary incontinence? No - Patient is safe to resume physical and sexual activity  6.  Health Maintenance - Last pap smear done in 01/2019 and was  normal with negative HPV.  16 minutes of non-face-to-face time spent with the patient    Reva Bores, MD Center for [facility], [facility]

## 2020-10-21 IMAGING — US US MFM OB COMP +14 WKS
1 series · 13 of 28 positions shown · non-contrast
Comparison: none

[Series 1: us mfm ob comp +14 wks · 95 acquisitions, 13 frames shown]
[im 4/95]
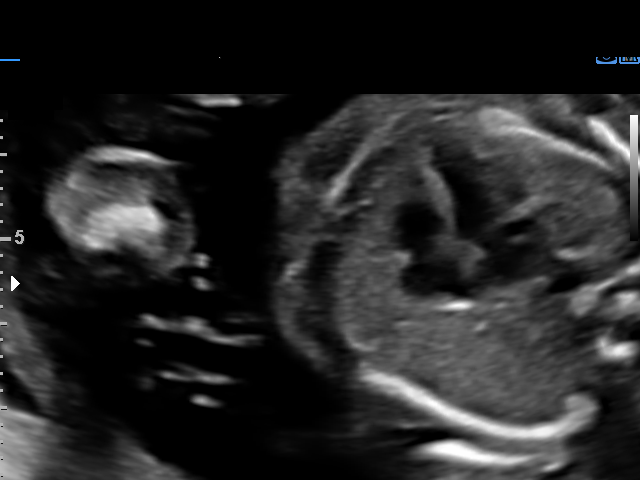
[im 11/95]
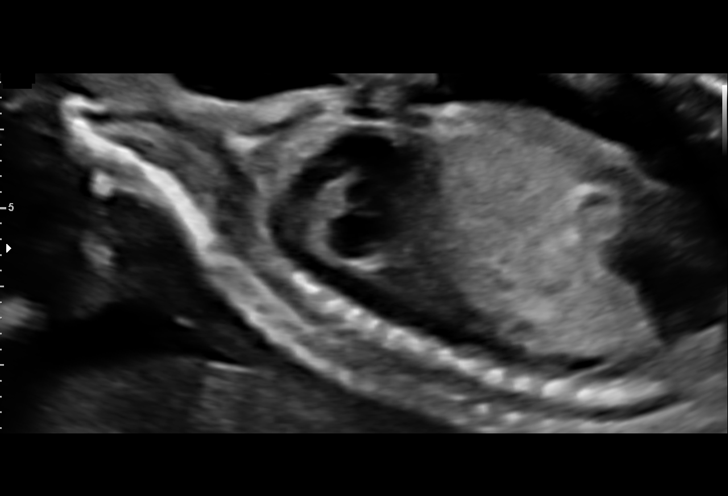
[im 18/95]
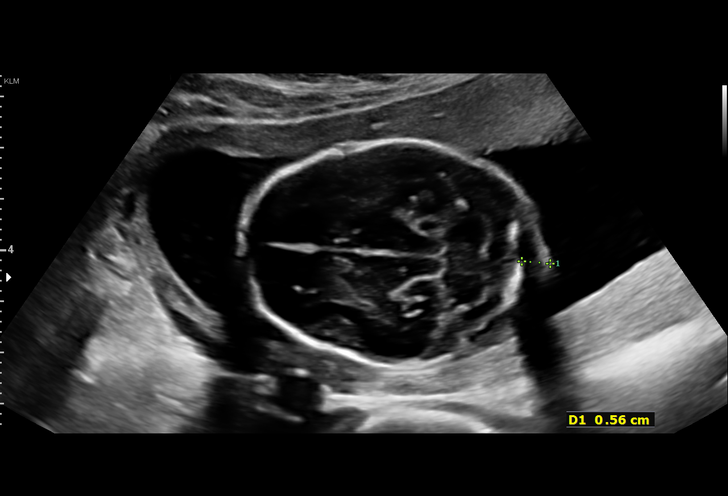
[im 25/95]
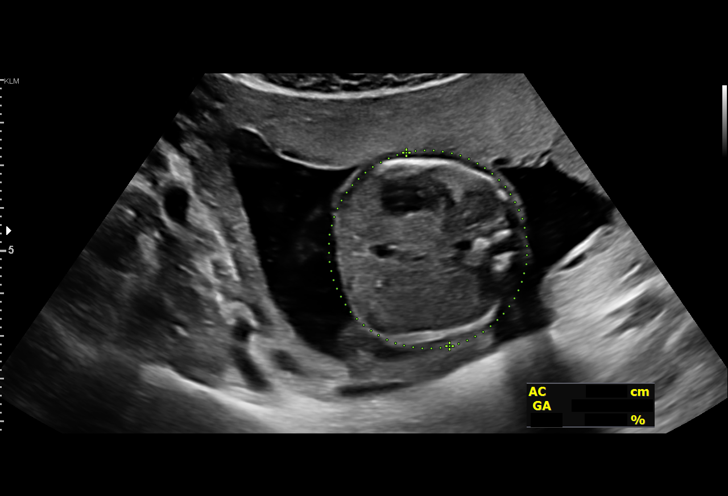
[im 32/95]
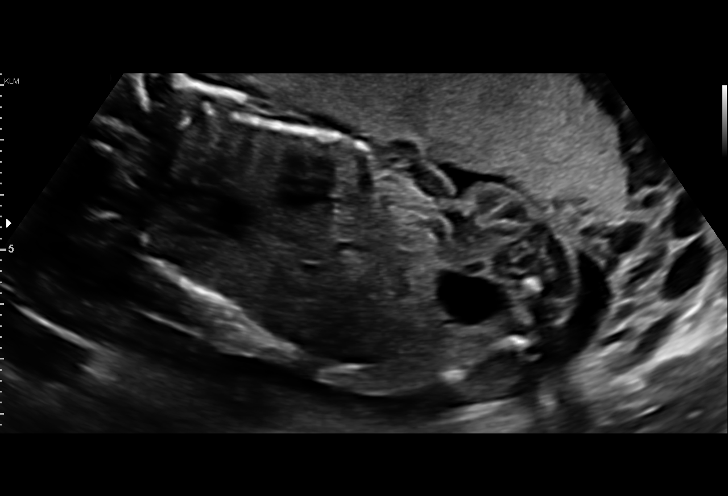
[im 39/95]
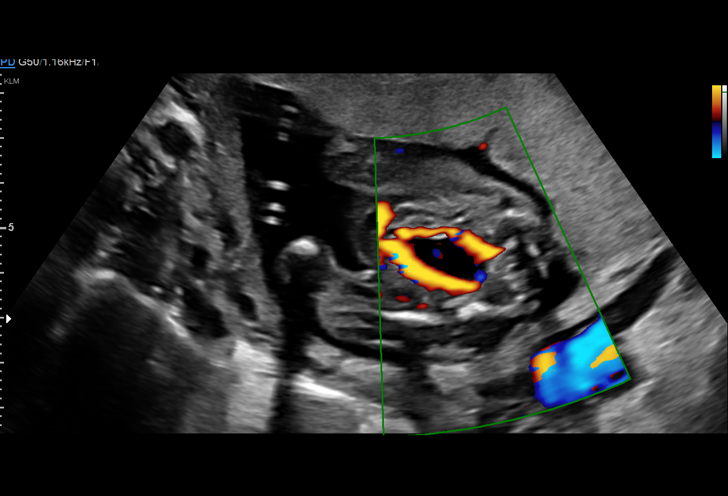
[im 49/95]
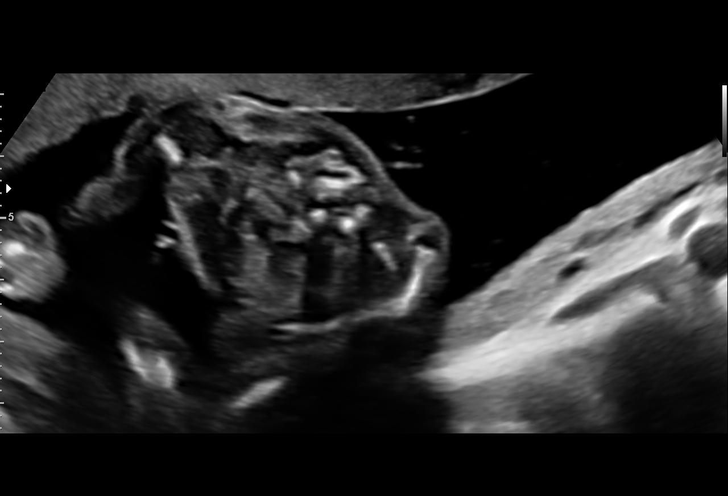
[im 56/95]
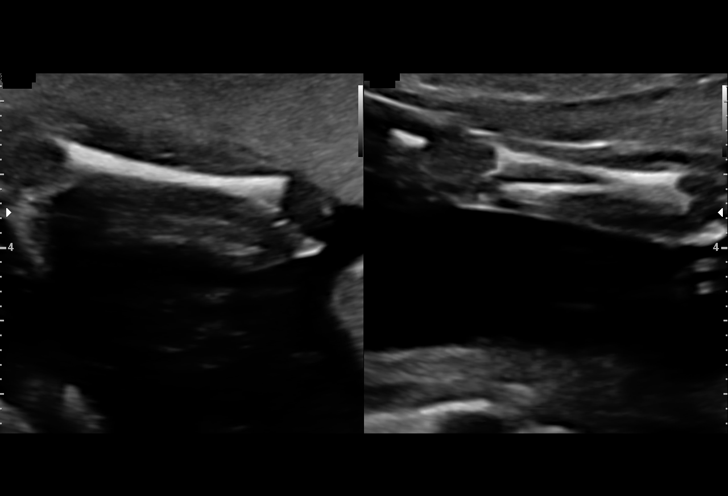
[im 63/95]
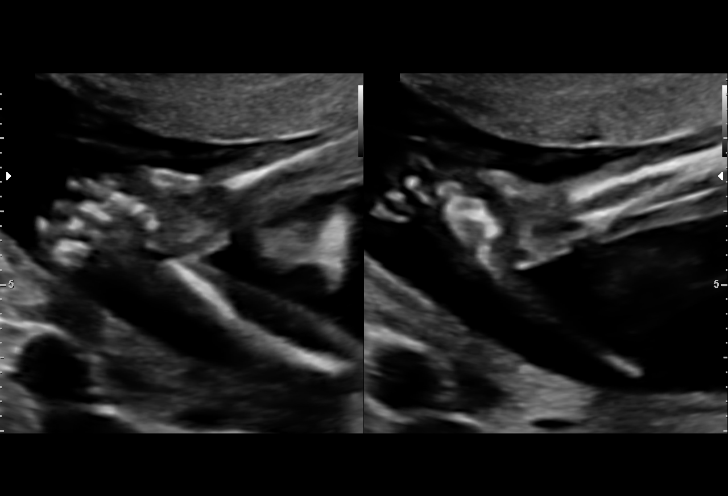
[im 70/95]
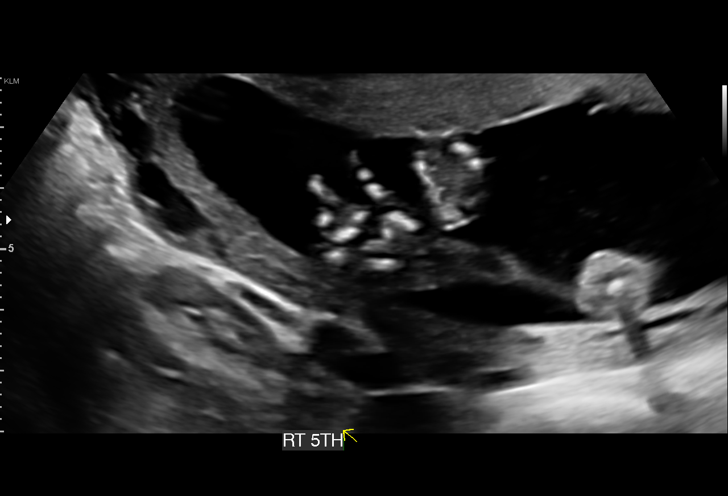
[im 77/95]
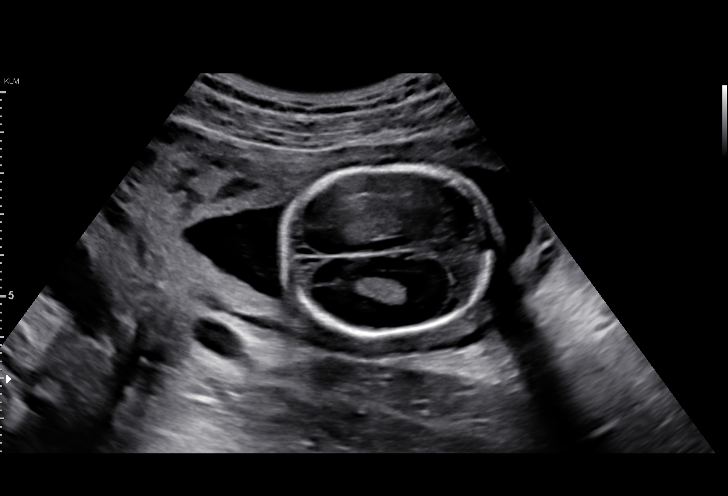
[im 84/95]
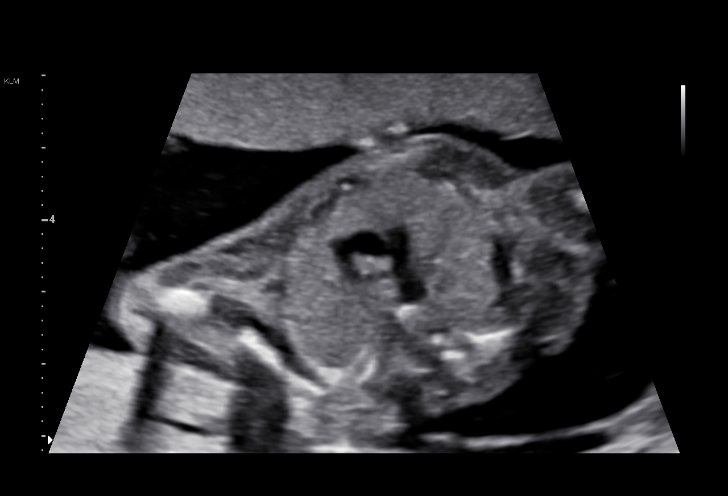
[im 91/95]
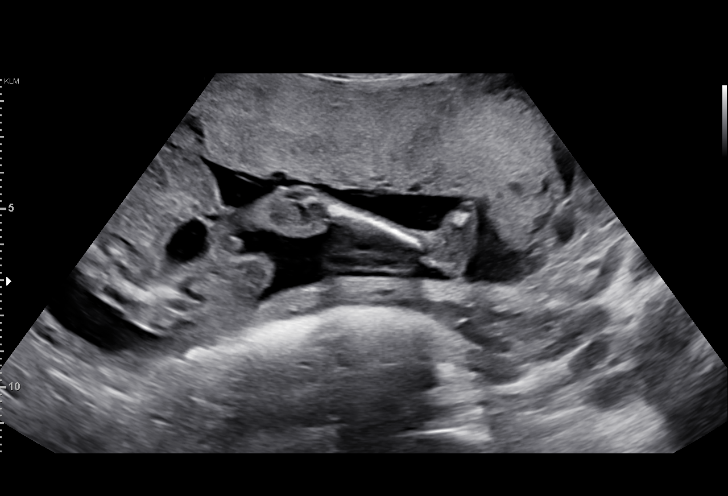

[13 of 28 positions shown; findings below may reference images not displayed]

GELBERTH NP

 ----------------------------------------------------------------------

 ----------------------------------------------------------------------
Indications

  Encounter for antenatal screening for
  malformations (low risk NIPS)
  19 weeks gestation of pregnancy
 ----------------------------------------------------------------------
Fetal Evaluation

 Num Of Fetuses:         1
 Fetal Heart Rate(bpm):  146
 Cardiac Activity:       Observed
 Presentation:           Breech
 Placenta:               Anterior
 P. Cord Insertion:      Visualized

 Amniotic Fluid
 AFI FV:      Within normal limits
Biometry

 BPD:        45  mm     G. Age:  19w 4d         76  %    CI:        72.72   %    70 - 86
                                                         FL/HC:      19.1   %    16.1 -
 HC:      167.8  mm     G. Age:  19w 3d         64  %    HC/AC:      1.16        1.09 -
 AC:       145   mm     G. Age:  19w 6d         73  %    FL/BPD:     71.1   %
 FL:         32  mm     G. Age:  20w 0d         77  %    FL/AC:      22.1   %    20 - 24
 HUM:      29.4  mm     G. Age:  19w 4d         66  %
 CER:      20.1  mm     G. Age:  19w 1d         52  %

 Est. FW:     316  gm    0 lb 11 oz      89  %
OB History

 Gravidity:    1         Term:   0        Prem:   0        SAB:   0
 TOP:          0       Ectopic:  0        Living: 0
Gestational Age

 LMP:           19w 0d        Date:  10/19/19                 EDD:   07/25/20
 U/S Today:     19w 5d                                        EDD:   07/20/20
 Best:          19w 0d     Det. By:  LMP  (10/19/19)          EDD:   07/25/20
Anatomy

 Cranium:               Appears normal         Aortic Arch:            Appears normal
 Cavum:                 Appears normal         Ductal Arch:            Appears normal
 Ventricles:            Appears normal         Diaphragm:              Appears normal
 Choroid Plexus:        Appears normal         Stomach:                Appears normal, left
                                                                       sided
 Cerebellum:            Appears normal         Abdomen:                Appears normal
 Posterior Fossa:       Appears normal         Abdominal Wall:         Appears nml (cord
                                                                       insert, abd wall)
 Face:                  Appears normal         Cord Vessels:           Appears normal (3
                        (orbits and profile)                           vessel cord)
 Lips:                  Appears normal         Kidneys:                Appear normal
 Thoracic:              Appears normal         Bladder:                Appears normal
 Heart:                 Appears normal         Spine:                  Appears normal
                        (4CH, axis, and
                        situs)
 RVOT:                  Appears normal         Upper Extremities:      Appears normal
 LVOT:                  Appears normal         Lower Extremities:      Appears normal

 Other:  Heels and right 5th digit visualized. 3VV and 3VTV visualized. Nasal
         bone visualized.
Cervix Uterus Adnexa

 Cervix
 Length:            4.5  cm.
 Normal appearance by transabdominal scan.

 Adnexa
 No abnormality visualized.
Comments

 This patient was seen for a detailed fetal anatomy scan. She
 denies any significant past medical history and denies any
 problems in her current pregnancy.
 She had a cell free DNA test earlier in her pregnancy which
 indicated a low risk for trisomy 21, 18, and 13. A male fetus is
 predicted.
 She was informed that the fetal growth and amniotic fluid
 level were appropriate for her gestational age.
 There were no obvious fetal anomalies noted on today's
 ultrasound exam.
 The patient was informed that anomalies may be missed due
 to technical limitations. If the fetus is in a suboptimal position
 or maternal habitus is increased, visualization of the fetus in
 the maternal uterus may be impaired.
 Follow up as indicated.

## 2020-11-01 DIAGNOSIS — Z0000 Encounter for general adult medical examination without abnormal findings: Secondary | ICD-10-CM | POA: Diagnosis not present

## 2020-11-01 DIAGNOSIS — Z6825 Body mass index (BMI) 25.0-25.9, adult: Secondary | ICD-10-CM | POA: Diagnosis not present

## 2020-11-12 ENCOUNTER — Ambulatory Visit: Payer: 59 | Admitting: Obstetrics and Gynecology

## 2021-04-01 ENCOUNTER — Encounter: Payer: Self-pay | Admitting: Obstetrics and Gynecology

## 2021-04-01 ENCOUNTER — Other Ambulatory Visit (HOSPITAL_COMMUNITY)
Admission: RE | Admit: 2021-04-01 | Discharge: 2021-04-01 | Disposition: A | Discharge status: Home / Self Care | Payer: 59 | Source: Ambulatory Visit | Attending: Obstetrics and Gynecology | Admitting: Obstetrics and Gynecology

## 2021-04-01 ENCOUNTER — Other Ambulatory Visit: Payer: Self-pay

## 2021-04-01 ENCOUNTER — Ambulatory Visit (INDEPENDENT_AMBULATORY_CARE_PROVIDER_SITE_OTHER): Payer: 59 | Admitting: Obstetrics and Gynecology

## 2021-04-01 VITALS — BP 100/62 | HR 76 | Ht 65.5 in | Wt 149.0 lb

## 2021-04-01 DIAGNOSIS — Z01419 Encounter for gynecological examination (general) (routine) without abnormal findings: Secondary | ICD-10-CM

## 2021-04-01 NOTE — Progress Notes (Signed)
30 y.o. G59P1001 Married Caucasian female here for annual exam.    She does labs with PCP.  Son is 76 months old.  Still breast feeding.  No menses yet.  Periods were regular prior to pregnancy.   Received her Covid booster.  PCP:  Waynette Buttery, PA-C   No LMP recorded (exact date).     Period Cycle (Days):  (Breastfeeding)     Sexually active: Yes.    The current method of family planning is condoms everytime.    Exercising: Yes.    cycling,walking Smoker:  no  Health Maintenance: Pap: 02-16-19 Neg:Neg HR HPV, 11/2017 normal per patient History of abnormal Pap:  Yes, Hx of LEEP for CIN 2/3 11/2016 in [place], [place].  Follow up paps normal. MMG:  n/a Colonoscopy:  n/a BMD:   n/a  Result  n/a TDaP:  07/2018 Gardasil:   Yes, completed HIV:05-02-20 NR Hep C:no Screening Labs:  PCP.    reports that she has never smoked. She has never used smokeless tobacco. She reports previous alcohol use. She reports that she does not use drugs.  Past Medical History:  Diagnosis Date  . Abnormal Pap smear of cervix 11/2016   Hx of LEEP 10/2015 in [place], [place] for CIN 2 from colposcopy.  Final LEEP pathology - LGSIL.  Marland Kitchen Anxiety    Not formally dx'd  . Dysmenorrhea   . Family history of breast cancer     Past Surgical History:  Procedure Laterality Date  . CERVICAL BIOPSY  W/ LOOP ELECTRODE EXCISION  11/2016   [place], [place]    Current Outpatient Medications  Medication Sig Dispense Refill  . acetaminophen (TYLENOL) 325 MG tablet Take 2 tablets (650 mg total) by mouth every 6 (six) hours.    . cetirizine (ZYRTEC) 10 MG tablet Take 1 tablet by mouth as needed.    . Docosahexaenoic Acid (PRENATAL DHA) 200 MG CAPS     . zinc gluconate 50 MG tablet Take by mouth.     No current facility-administered medications for this visit.    Family History  Problem Relation Age of Onset  . Breast cancer Mother 4       Dec with Met.Breast Ca  . Migraines Mother   . Migraines Sister   . Migraines  Brother   . Stroke Maternal Grandfather   . Diabetes Paternal Grandmother   . Diabetes Paternal Grandfather   . Leukemia Cousin 10       pat first cousin    Review of Systems  All other systems reviewed and are negative.   Exam:   BP 100/62   Pulse 76   Ht 5' 5.5" (1.664 m)   Wt 149 lb (67.6 kg)   LMP  (Exact Date)   SpO2 97%   Breastfeeding Yes   BMI 24.42 kg/m     General appearance: alert, cooperative and appears stated age Head: normocephalic, without obvious abnormality, atraumatic Neck: no adenopathy, supple, symmetrical, trachea midline and thyroid normal to inspection and palpation Lungs: clear to auscultation bilaterally Breasts: normal appearance, no masses or tenderness, No nipple retraction or dimpling, No nipple discharge or bleeding, No axillary adenopathy Heart: regular rate and rhythm Abdomen: soft, non-tender; no masses, no organomegaly Extremities: extremities normal, atraumatic, no cyanosis or edema Skin: skin color, texture, turgor normal. No rashes or lesions Lymph nodes: cervical, supraclavicular, and axillary nodes normal. Neurologic: grossly normal  Pelvic: External genitalia:  no lesions  No abnormal inguinal nodes palpated.              Urethra:  normal appearing urethra with no masses, tenderness or lesions              Bartholins and Skenes: normal                 Vagina: normal appearing vagina with normal color and discharge, no lesions              Cervix: no lesions              Pap taken: Yes.   Bimanual Exam:  Uterus:  normal size, contour, position, consistency, mobility, non-tender              Adnexa: no mass, fullness, tenderness       Chaperone was present for exam.  Assessment:   Well woman visit with normal exam. Fh breast cancer.   Increased lifetime risk of breast cancer with lifetime risk of 28.9%.  Negative genetic screening.  Hx LEEP for CIN 2/3 in 2017.  Plan: Yearly mammogram screening alternating every  6 months with breast MRI to begin 3 months after termination of breast feeding.  Self breast awareness reviewed. Pap and HR HPV collected today.  Guidelines for Calcium, Vitamin D, regular exercise program including cardiovascular and weight bearing exercise. We reviewed contraceptive options:  IUDs, Nexplanon, Depo Provera, progesterone only birth control pills, and condoms with spermicide. Follow up annually and prn.

## 2021-04-01 NOTE — Patient Instructions (Signed)

## 2021-04-02 LAB — CYTOLOGY - PAP
Comment: NEGATIVE
Diagnosis: NEGATIVE
High risk HPV: NEGATIVE

## 2021-08-16 DIAGNOSIS — Z7185 Encounter for immunization safety counseling: Secondary | ICD-10-CM | POA: Diagnosis not present
